# Patient Record
Sex: Female | Born: 1945 | Race: White | Hispanic: No | Marital: Married | Smoking: Never smoker
Health system: Southern US, Community
[De-identification: ages and names within clinical notes are randomized; demographics above are authoritative.]

## PROBLEM LIST (undated history)

## (undated) DIAGNOSIS — C801 Malignant (primary) neoplasm, unspecified: Secondary | ICD-10-CM

## (undated) DIAGNOSIS — M1990 Unspecified osteoarthritis, unspecified site: Secondary | ICD-10-CM

## (undated) DIAGNOSIS — T8859XA Other complications of anesthesia, initial encounter: Secondary | ICD-10-CM

## (undated) DIAGNOSIS — R928 Other abnormal and inconclusive findings on diagnostic imaging of breast: Secondary | ICD-10-CM

## (undated) DIAGNOSIS — I10 Essential (primary) hypertension: Secondary | ICD-10-CM

## (undated) DIAGNOSIS — K2270 Barrett's esophagus without dysplasia: Secondary | ICD-10-CM

## (undated) DIAGNOSIS — K219 Gastro-esophageal reflux disease without esophagitis: Secondary | ICD-10-CM

## (undated) DIAGNOSIS — N189 Chronic kidney disease, unspecified: Secondary | ICD-10-CM

## (undated) DIAGNOSIS — K3184 Gastroparesis: Secondary | ICD-10-CM

## (undated) HISTORY — DX: Essential (primary) hypertension: I10

## (undated) HISTORY — DX: Malignant (primary) neoplasm, unspecified: C80.1

## (undated) HISTORY — DX: Other abnormal and inconclusive findings on diagnostic imaging of breast: R92.8

## (undated) HISTORY — PX: ESOPHAGOGASTRODUODENOSCOPY: SHX1529

---

## 1966-07-12 DIAGNOSIS — C801 Malignant (primary) neoplasm, unspecified: Secondary | ICD-10-CM

## 1966-07-12 HISTORY — DX: Malignant (primary) neoplasm, unspecified: C80.1

## 1966-07-12 HISTORY — PX: DILATION AND CURETTAGE OF UTERUS: SHX78

## 1990-07-12 HISTORY — PX: BREAST BIOPSY: SHX20

## 1991-07-13 HISTORY — PX: APPENDECTOMY: SHX54

## 1991-07-13 HISTORY — PX: ABDOMINAL HYSTERECTOMY: SHX81

## 1993-07-12 DIAGNOSIS — I10 Essential (primary) hypertension: Secondary | ICD-10-CM

## 1993-07-12 HISTORY — DX: Essential (primary) hypertension: I10

## 1997-07-12 DIAGNOSIS — K3184 Gastroparesis: Secondary | ICD-10-CM

## 1997-07-12 HISTORY — DX: Gastroparesis: K31.84

## 2005-02-01 ENCOUNTER — Ambulatory Visit: Payer: Self-pay | Admitting: Unknown Physician Specialty

## 2005-04-28 ENCOUNTER — Ambulatory Visit: Payer: Self-pay | Admitting: Unknown Physician Specialty

## 2005-04-29 ENCOUNTER — Ambulatory Visit: Payer: Self-pay | Admitting: Unknown Physician Specialty

## 2006-07-06 ENCOUNTER — Ambulatory Visit: Payer: Self-pay | Admitting: Unknown Physician Specialty

## 2007-07-19 ENCOUNTER — Ambulatory Visit: Payer: Self-pay | Admitting: Unknown Physician Specialty

## 2008-10-10 ENCOUNTER — Ambulatory Visit: Payer: Self-pay | Admitting: Unknown Physician Specialty

## 2009-07-29 ENCOUNTER — Ambulatory Visit: Payer: Self-pay | Admitting: Unknown Physician Specialty

## 2010-08-20 ENCOUNTER — Ambulatory Visit: Payer: Self-pay | Admitting: Unknown Physician Specialty

## 2010-09-01 ENCOUNTER — Ambulatory Visit: Payer: Self-pay | Admitting: Unknown Physician Specialty

## 2011-03-03 ENCOUNTER — Ambulatory Visit: Payer: Self-pay | Admitting: Unknown Physician Specialty

## 2011-08-25 ENCOUNTER — Ambulatory Visit: Payer: Self-pay | Admitting: Unknown Physician Specialty

## 2012-09-06 ENCOUNTER — Ambulatory Visit: Payer: Self-pay | Admitting: Physician Assistant

## 2012-09-09 HISTORY — PX: BREAST BIOPSY: SHX20

## 2012-09-20 ENCOUNTER — Encounter: Payer: Self-pay | Admitting: *Deleted

## 2012-09-26 ENCOUNTER — Other Ambulatory Visit (INDEPENDENT_AMBULATORY_CARE_PROVIDER_SITE_OTHER): Payer: Medicare Other

## 2012-09-26 ENCOUNTER — Other Ambulatory Visit: Payer: Self-pay | Admitting: General Surgery

## 2012-09-26 ENCOUNTER — Encounter: Payer: Self-pay | Admitting: General Surgery

## 2012-09-26 ENCOUNTER — Ambulatory Visit (INDEPENDENT_AMBULATORY_CARE_PROVIDER_SITE_OTHER): Payer: Medicare Other | Admitting: General Surgery

## 2012-09-26 VITALS — BP 132/74 | HR 66 | Resp 12 | Ht 64.0 in | Wt 224.0 lb

## 2012-09-26 DIAGNOSIS — N63 Unspecified lump in unspecified breast: Secondary | ICD-10-CM

## 2012-09-26 NOTE — Progress Notes (Signed)
Subjective:     Patient ID: Megan Hodge, female   DOB: Jul 24, 1945, 67 y.o.   MRN: [id number]  HPI   Review of Systems  Constitutional: Negative.   Respiratory: Negative.   Cardiovascular: Negative.        Objective:   Physical Exam  Constitutional: She appears well-developed and well-nourished.  Neck: Normal range of motion. Neck supple.  Cardiovascular: Normal rate and normal heart sounds.   Pulmonary/Chest: Effort normal and breath sounds normal. Right breast exhibits no inverted nipple, no mass, no nipple discharge, no skin change and no tenderness. Left breast exhibits no inverted nipple, no nipple discharge, no skin change and no tenderness.       Assessment:     Atypical ductal hyperplasia of the right breast.  Recently completed biopsy for a well-defined nodular density in clock position of the right breast showed ADH.    Plan:     Formal excision of the area has been recommended. Ultrasound examination today confirmed a 0.5 x 0.6 x 0.6 x 1.5 cm cavity from the previous biopsy located 1 cm from the nipple to o'clock position.  The indications for excision were reviewed with the patient and her husband. We'll plan to complete this in a convenient date  As an outpatient at [facility].

## 2012-09-26 NOTE — Patient Instructions (Signed)
Patient to be scheduled for a right breast biopsy at [facility]. This patient has paperwork and instructions have been reviewed. She will be contacted once date has been arranged.

## 2012-09-27 ENCOUNTER — Telehealth: Payer: Self-pay | Admitting: *Deleted

## 2012-09-27 ENCOUNTER — Other Ambulatory Visit: Payer: Self-pay | Admitting: General Surgery

## 2012-09-27 ENCOUNTER — Encounter: Payer: Self-pay | Admitting: General Surgery

## 2012-09-27 NOTE — Telephone Encounter (Signed)
Patient was contacted today and informed right breast biopsy has been scheduled at [facility] for 10-05-12. She verbalizes understanding.

## 2012-09-28 ENCOUNTER — Ambulatory Visit: Payer: Self-pay

## 2012-10-02 ENCOUNTER — Ambulatory Visit: Payer: Self-pay | Admitting: Anesthesiology

## 2012-10-02 LAB — BASIC METABOLIC PANEL
Calcium, Total: 9.2 mg/dL (ref 8.5–10.1)
Co2: 32 mmol/L (ref 21–32)
Creatinine: 1.05 mg/dL (ref 0.60–1.30)
EGFR (African American): >60
EGFR (Non-African Amer.): 55 — ABNORMAL LOW
Potassium: 3.5 mmol/L (ref 3.5–5.1)

## 2012-10-03 ENCOUNTER — Other Ambulatory Visit: Payer: Self-pay | Admitting: General Surgery

## 2012-10-03 DIAGNOSIS — N6099 Unspecified benign mammary dysplasia of unspecified breast: Secondary | ICD-10-CM

## 2012-10-05 ENCOUNTER — Ambulatory Visit: Payer: Self-pay | Admitting: General Surgery

## 2012-10-05 DIAGNOSIS — N63 Unspecified lump in unspecified breast: Secondary | ICD-10-CM

## 2012-10-06 ENCOUNTER — Telehealth: Payer: Self-pay | Admitting: General Surgery

## 2012-10-06 NOTE — Telephone Encounter (Signed)
The patient underwent wide local excision at the site of the previous area of ADH the biopsy results showed benign breast tissue with columnar cell changes, a Parker metaplasia in the usual ductal hyperplasia. The biopsy cavity and clip was identified. No evidence of atypia or malignancy.  The patient has been notified of the results.  She reports she's had minimal discomfort since surgery. She will followup on 10/12/2012 as scheduled for her postoperative visit

## 2012-10-10 ENCOUNTER — Encounter: Payer: Self-pay | Admitting: General Surgery

## 2012-10-12 ENCOUNTER — Encounter: Payer: Self-pay | Admitting: General Surgery

## 2012-10-12 ENCOUNTER — Ambulatory Visit (INDEPENDENT_AMBULATORY_CARE_PROVIDER_SITE_OTHER): Payer: Medicare Other | Admitting: General Surgery

## 2012-10-12 VITALS — BP 130/70 | HR 76 | Resp 14 | Ht 64.0 in | Wt 225.0 lb

## 2012-10-12 DIAGNOSIS — N6099 Unspecified benign mammary dysplasia of unspecified breast: Secondary | ICD-10-CM

## 2012-10-12 DIAGNOSIS — N6089 Other benign mammary dysplasias of unspecified breast: Secondary | ICD-10-CM

## 2012-10-12 MED ORDER — TAMOXIFEN CITRATE 20 MG PO TABS
20.0000 mg | ORAL_TABLET | Freq: Every day | ORAL | Status: DC
Start: 2012-10-12 — End: 2013-04-04

## 2012-10-12 NOTE — Progress Notes (Signed)
Patient ID: Megan Hodge, female   DOB: 03/04/1946, 67 y.o.   MRN: [id number]  Chief Complaint  Patient presents with  . Routine Post Op    7-10 day breast biopsy    HPI Megan Hodge is a 67 y.o. female who presents for post op right breast biopsy. The procedure was performed on 10/05/12. The patient states she is doing well with the exception of getting comfortable while sleeping. She is no longer taking her pain medication.  HPI  Past Medical History  Diagnosis Date  . Hypertension 1995  . Cancer 1968    cervical cancer  . Abnormal mammogram, unspecified     right breast    Past Surgical History  Procedure Laterality Date  . Abdominal hysterectomy  1993  . Appendectomy  1993  . Dilation and curettage of uterus  1968  . Breast biopsy Right 1992  . Breast biopsy Right March 2014    atypical ductal hyperplasia.    Family History  Problem Relation Age of Onset  . Cancer Mother 7    lung    Social History History  Substance Use Topics  . Smoking status: Never Smoker   . Smokeless tobacco: Not on file  . Alcohol Use: No    Allergies  Allergen Reactions  . Ivp Dye (Iodinated Diagnostic Agents)     Redness     Current Outpatient Prescriptions  Medication Sig Dispense Refill  . benazepril-hydrochlorthiazide (LOTENSIN HCT) 20-12.5 MG per tablet Take 1 tablet by mouth daily.      . Omeprazole (PRILOSEC PO) Take by mouth.      . tamoxifen (NOLVADEX) 20 MG tablet Take 1 tablet (20 mg total) by mouth daily.  30 tablet  11   No current facility-administered medications for this visit.    Review of Systems Review of Systems  Constitutional: Negative.   Respiratory: Negative.   Cardiovascular: Negative.     Blood pressure 130/70, pulse 76, resp. rate 14, height 5\' 4"  (1.626 m), weight 225 lb (102.059 kg).  Physical Exam Physical Exam  Constitutional: She appears well-developed and well-nourished.  Pulmonary/Chest: Right breast exhibits no inverted nipple, no  mass, no nipple discharge, no skin change and no tenderness.    Data Reviewed The patient's original ultrasound-guided core biopsy showed an area of ADH. Formal reexploration completed October 05, 2012 showed no residual ADH. No DCIS or invasive cancer.  Assessment    ADH right breast.     Plan    The increased risk for breast malignancy and patient's identified with ADH was reviewed with the patient. The opportunity to make use of chemotherapy prevention to lower her risk by 50% (4% to 2%) was discussed. The major risks associated with tamoxifen therapy are DVT and vasomotor symptoms. .  The patient is amenable to a trial of tamoxifen, 20 mg daily. She's been asked to make use of a daily aspirin tablet with this to minimize the risk of DVT. She's been asked to do a phone followup in one month to report her tolerance of the medication.       Megan Hodge 10/12/2012, 10:14 PM

## 2012-10-12 NOTE — Patient Instructions (Addendum)
Patient advised of her options to prevent breast cancer. Options include taking Tamoxifen once a day with Aspirin or just continue to check by surveillance. Patient to call in 1 month with update on how she's doing on Tamoxifen. She is to return in 6 months with right diagnostic mammogram.

## 2012-10-24 ENCOUNTER — Encounter: Payer: Medicare Other | Admitting: General Surgery

## 2012-11-13 ENCOUNTER — Telehealth: Payer: Self-pay

## 2012-11-13 NOTE — Telephone Encounter (Signed)
Patient called to let you know that she did take her Tamoxifen for 30 days. She states she felt bad the whole time she was on it.  She has decided to stop taking the medication and wanted to let you know what her decision was.

## 2012-11-18 NOTE — Telephone Encounter (Signed)
Patient reports poor tolerance of Tamoxifen prescribed as ADH noted on March 2014 biopsy. Will follow with serial mammograms.

## 2013-03-14 ENCOUNTER — Encounter: Payer: Self-pay | Admitting: General Surgery

## 2013-03-14 ENCOUNTER — Ambulatory Visit: Payer: Self-pay | Admitting: General Surgery

## 2013-04-04 ENCOUNTER — Ambulatory Visit (INDEPENDENT_AMBULATORY_CARE_PROVIDER_SITE_OTHER): Payer: Medicare Other | Admitting: General Surgery

## 2013-04-04 ENCOUNTER — Encounter: Payer: Self-pay | Admitting: General Surgery

## 2013-04-04 VITALS — BP 124/68 | HR 74 | Resp 14 | Ht 65.0 in | Wt 226.0 lb

## 2013-04-04 DIAGNOSIS — N6099 Unspecified benign mammary dysplasia of unspecified breast: Secondary | ICD-10-CM

## 2013-04-04 DIAGNOSIS — N6089 Other benign mammary dysplasias of unspecified breast: Secondary | ICD-10-CM

## 2013-04-04 NOTE — Patient Instructions (Addendum)
Patient to return in six months from bilateral screening mammogram

## 2013-04-04 NOTE — Progress Notes (Signed)
Patient ID: Megan Hodge, female   DOB: [birth year], 67 y.o.   MRN: [id number]  Chief Complaint  Patient presents with  . Follow-up    right breast mammogram    HPI Megan Hodge is a 67 y.o. female who presents for a breast evaluation. The most recent right breast  mammogram was done on 03/14/13.  The patient underwent stereotactic biopsy for microcalcifications with identification of ADH in March 2014. Resection of this area showed no upstaging.  The patient took her tamoxifen for one month but found the vasomotor symptoms intolerable. Patient does perform regular self breast checks and gets regular mammograms done.    HPI  Past Medical History  Diagnosis Date  . Hypertension 1995  . Cancer 1968    cervical cancer  . Abnormal mammogram, unspecified     right breast    Past Surgical History  Procedure Laterality Date  . Abdominal hysterectomy  1993  . Appendectomy  1993  . Dilation and curettage of uterus  1968  . Breast biopsy Right 1992  . Breast biopsy Right March 2014    atypical ductal hyperplasia.    Family History  Problem Relation Age of Onset  . Cancer Mother 60    lung    Social History History  Substance Use Topics  . Smoking status: Never Smoker   . Smokeless tobacco: Not on file  . Alcohol Use: No    Allergies  Allergen Reactions  . Ivp Dye [Iodinated Diagnostic Agents]     Redness     Current Outpatient Prescriptions  Medication Sig Dispense Refill  . benazepril-hydrochlorthiazide (LOTENSIN HCT) 20-12.5 MG per tablet Take 1 tablet by mouth daily.      . Omeprazole (PRILOSEC PO) Take by mouth.       No current facility-administered medications for this visit.    Review of Systems Review of Systems  Constitutional: Negative.   Respiratory: Negative.   Cardiovascular: Negative.     Blood pressure 124/68, pulse 74, resp. rate 14, height 5\' 5"  (1.651 m), weight 226 lb (102.513 kg).  Physical Exam Physical Exam  Constitutional: She is  oriented to person, place, and time. She appears well-developed and well-nourished.  Cardiovascular: Normal rate, regular rhythm and normal heart sounds.   Pulmonary/Chest: Breath sounds normal. Right breast exhibits no inverted nipple, no mass, no nipple discharge, no skin change and no tenderness. Left breast exhibits no inverted nipple, no mass, no nipple discharge, no skin change and no tenderness.  Right breast incision well healed from 9 to 12 o'clock  Abdominal: Soft. Normal appearance.  Lymphadenopathy:    She has no cervical adenopathy.    She has no axillary adenopathy.  Neurological: She is alert and oriented to person, place, and time.  Skin: Skin is warm and dry.    Data Reviewed Pathology showed ADH.  Assessment    Doing well status post wide excision.     Plan    Options for further management were reviewed: 1) continued observation versus 2) trial of substituting Evista for tamoxifen. The patient will contact her insurance company to see what her cost might be.  We'll plan for bilateral screening mammograms in 6 months.        [name] 04/04/2013, 9:35 PM

## 2013-04-13 ENCOUNTER — Ambulatory Visit: Payer: Self-pay | Admitting: Unknown Physician Specialty

## 2013-04-15 IMAGING — US ULTRASOUND RIGHT BREAST
1 series · 13 of 21 positions shown · non-contrast
Comparison: 08/25/2011, 03/03/2011, 08/20/2010, 10/10/2008

Right Breast Ultrasound of 08/25/2011, 09/01/2010.

REASON FOR EXAM: RT BRST MASS FU AND YRLY
COMMENTS:

PROCEDURE:     US  - US BREAST RIGHT  - September 06, 2012 [DATE]
RESULT:

[Series 1: ultrasound right breast · 0.08mm/px · 13 of 21 slices shown]
[im 1/21]
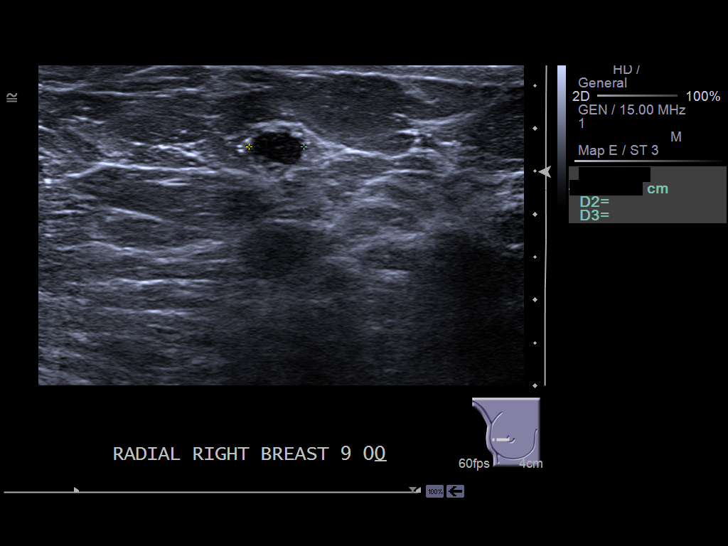
[im 3/21]
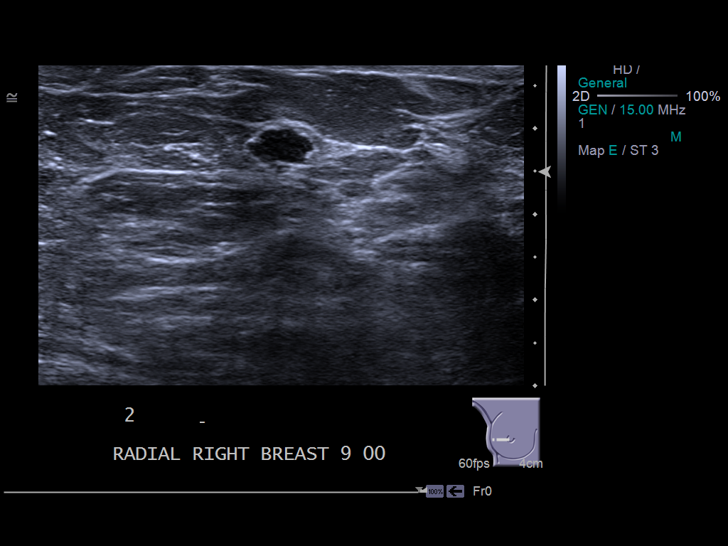
[im 5/21]
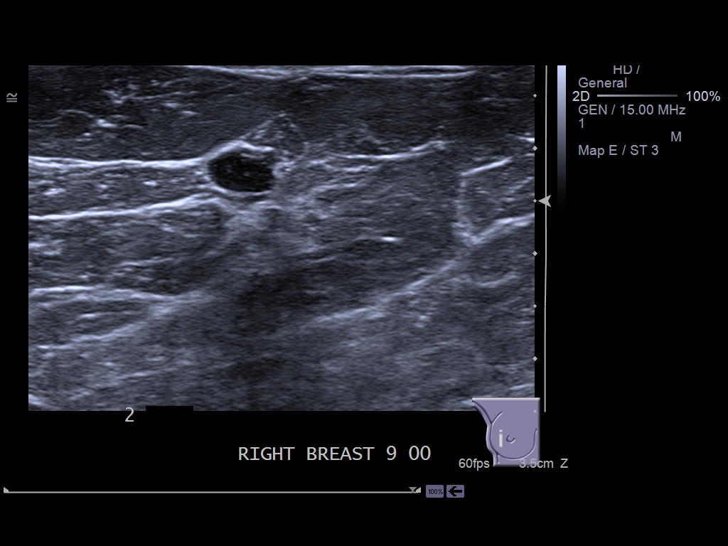
[im 6/21]
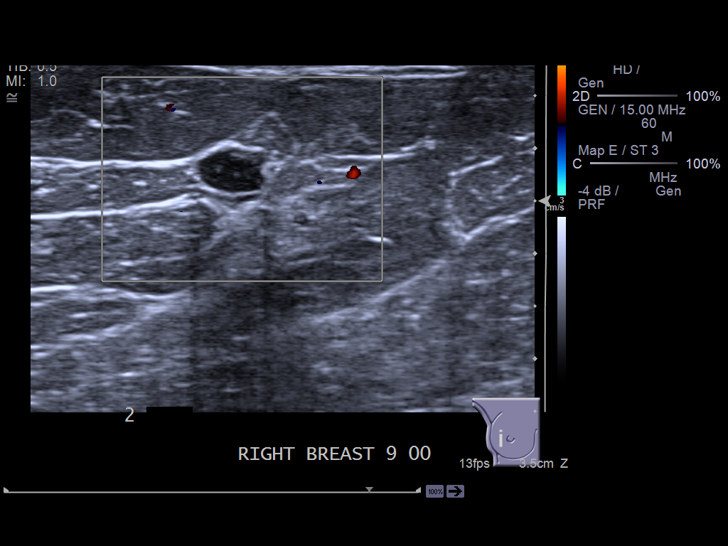
[im 8/21]
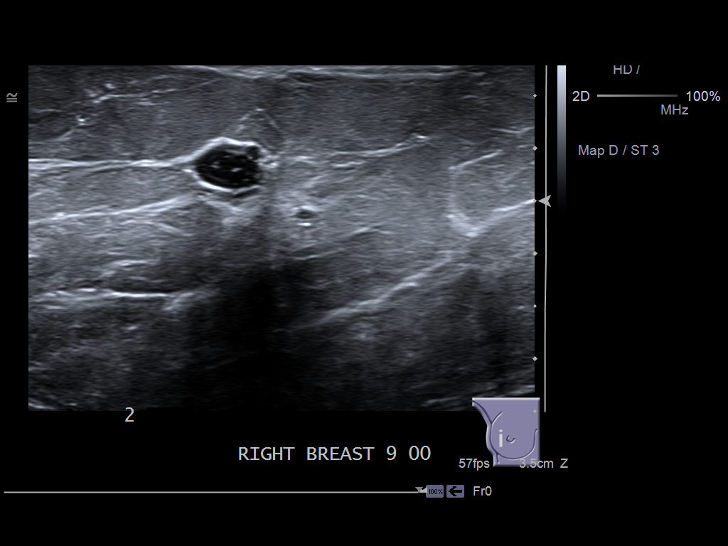
[im 9/21]
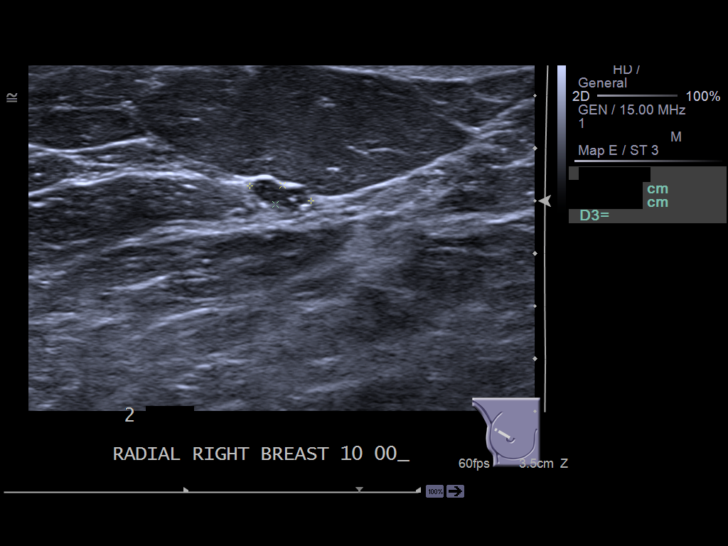
[im 11/21]
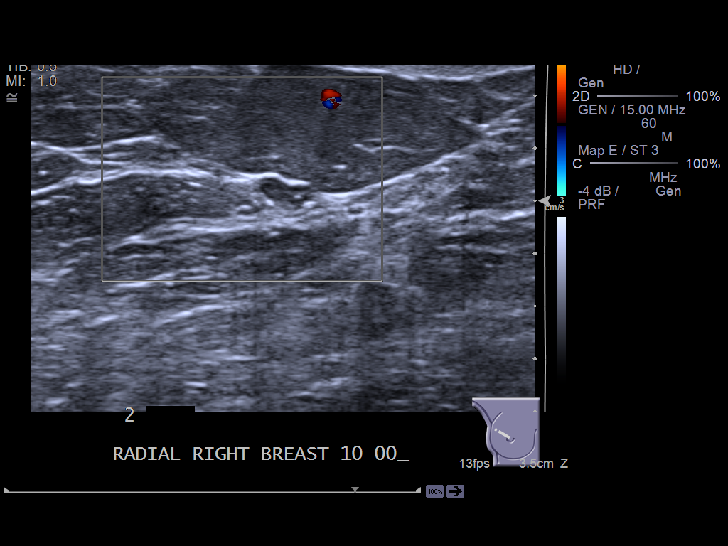
[im 13/21]
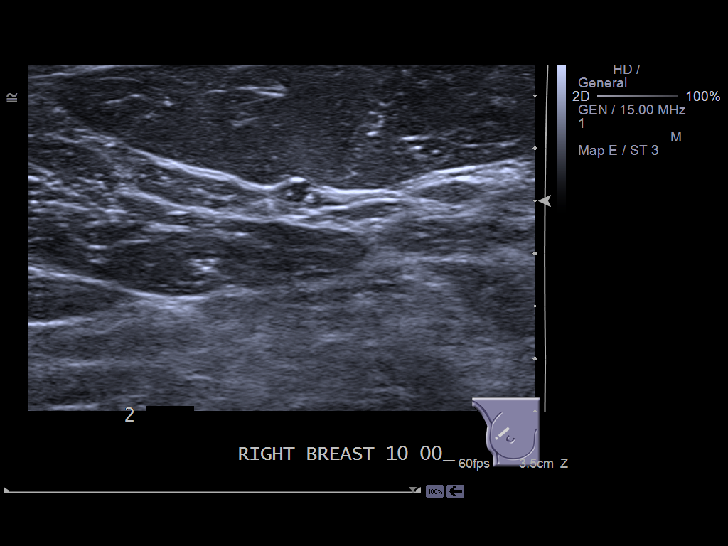
[im 14/21]
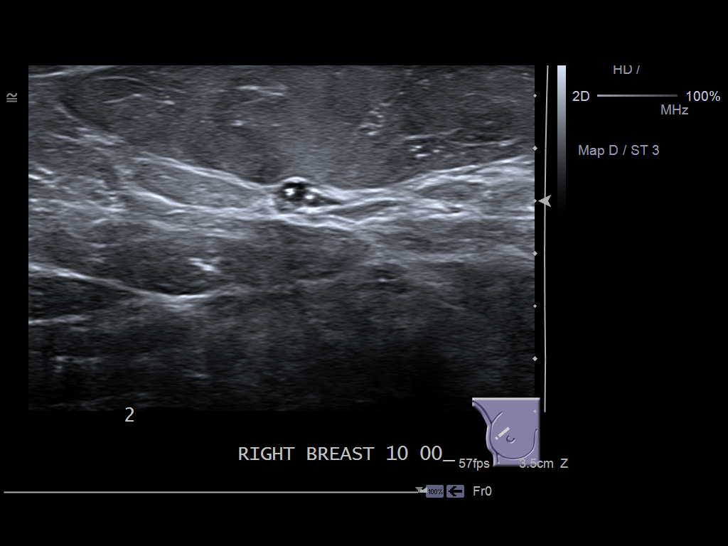
[im 16/21]
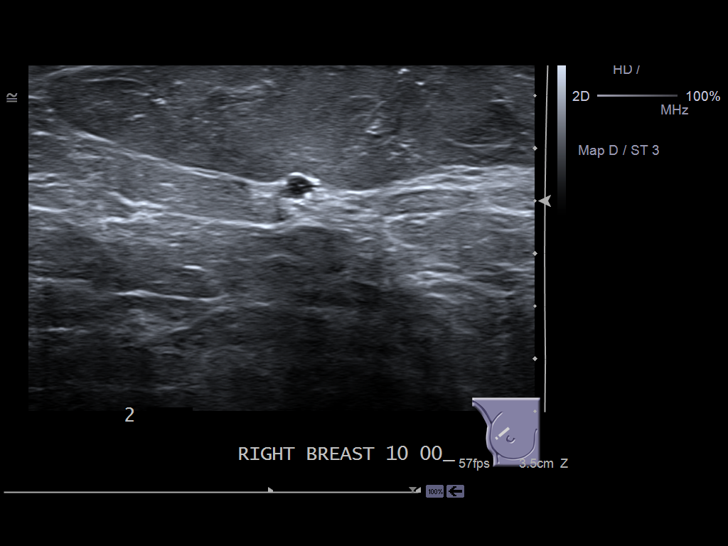
[im 17/21]
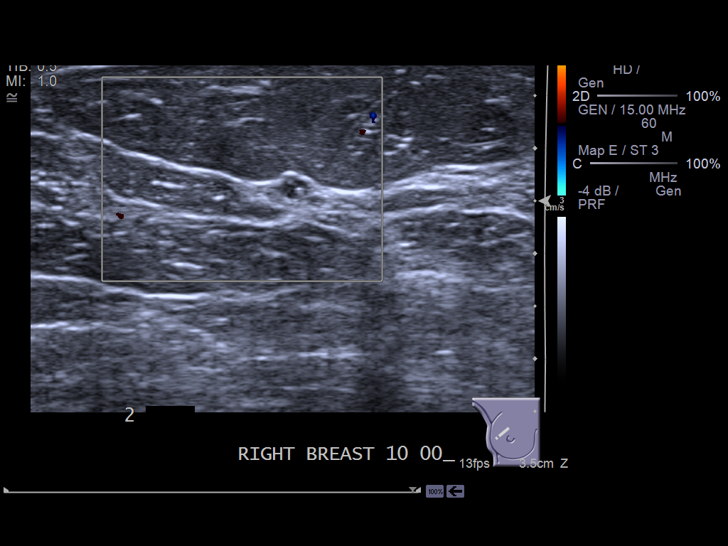
[im 19/21]
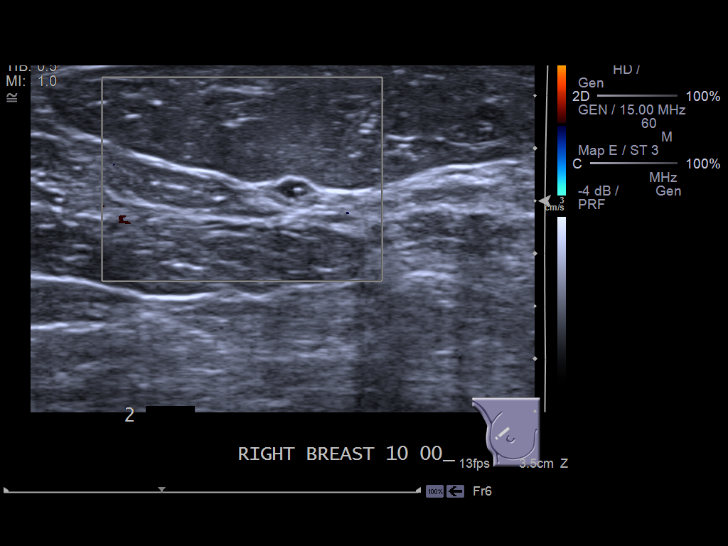
[im 21/21]
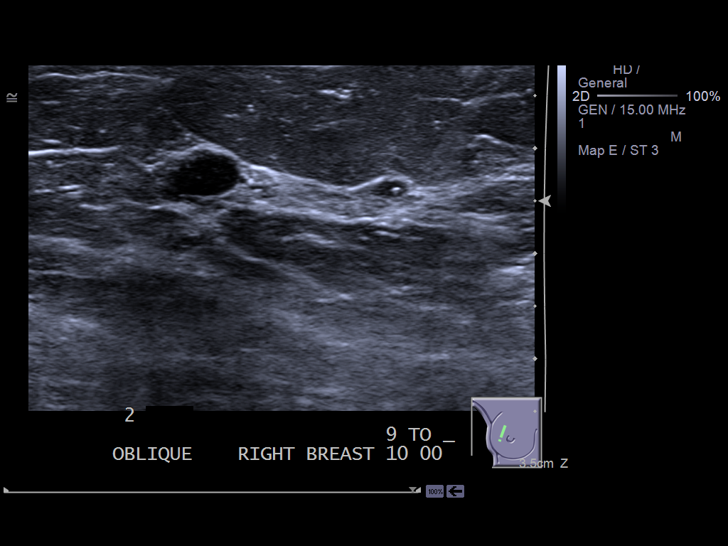

[13 of 21 positions shown; findings below may reference images not displayed]

FINDINGS: There is scattered fibroglandular tissue. The small, round mass in the far
anterior depth of the left breast retroareolar location is similar to
multiple prior studies and likely represents a cyst or fibroadenoma. Spot
compression magnification views were performed of the superolateral aspect
of the right breast anterior depth. Again demonstrated is the small, round
mass which is relatively similar to prior. Adjacent to the round mass there
is a small cluster of round calcifications. These appear more conspicuous
than on prior studies. Additionally, there is a small focal asymmetry in the
region of these calcifications which is more conspicuous than prior as well.

Real-time ultrasound was performed of the anterior right breast at 9-10:00.
At [DATE] there is a round mass which is hypoechoic. There does not appear to
be significant posterior acoustic shadowing. It measures 0.6 x 0.6 x 0.5 cm.
This is similar in size to prior. However, previously the mass was anechoic
and demonstrated acoustic through transmission. On today's study it is
hypoechoic. At [DATE] there is a small, lobulated hypoechoic mass. It
measures 0.6 x 0.4 x 0.2 cm. There is at least one, and possibly two,
hyperechoic foci within the mass which could represent calcifications. This
mass is slightly increased in conspicuity from prior studies. There is no
significant posterior acoustic shadowing or through transmission.
IMPRESSION: 1.     BI-RADS: Category 4 - Suspicious Abnormality. Surgical consultation
and tissue diagnosis are recommended. The small, lobulated mass at [DATE] is
slightly increased in size from prior. This mass likely correlates with the
findings of a small cluster of microcalcifications and associated focal
asymmetry on the mammograms, which have also increased slightly in
conspicuity from prior.
2.     The mass at [DATE] is similar in size to prior studies. However, on
today's study it is hypoechoic and without acoustic through transmission. It
is possible this is related to proteinaceous material or hemorrhage within a
previous cyst. However, given the findings today, cyst aspiration with
potential for biopsy if solid is recommended.

## 2013-09-10 ENCOUNTER — Ambulatory Visit: Payer: Self-pay | Admitting: General Surgery

## 2013-09-11 ENCOUNTER — Encounter: Payer: Self-pay | Admitting: General Surgery

## 2013-10-09 ENCOUNTER — Encounter: Payer: Self-pay | Admitting: General Surgery

## 2013-10-09 ENCOUNTER — Ambulatory Visit (INDEPENDENT_AMBULATORY_CARE_PROVIDER_SITE_OTHER): Payer: Medicare Other | Admitting: General Surgery

## 2013-10-09 VITALS — BP 130/74 | HR 68 | Resp 14 | Ht 65.0 in | Wt 229.0 lb

## 2013-10-09 DIAGNOSIS — N6089 Other benign mammary dysplasias of unspecified breast: Secondary | ICD-10-CM

## 2013-10-09 DIAGNOSIS — N6099 Unspecified benign mammary dysplasia of unspecified breast: Secondary | ICD-10-CM

## 2013-10-09 NOTE — Progress Notes (Signed)
Patient ID: Megan Hodge, female   DOB: January 24, 1946, 68 y.o.   MRN: [id number]  Chief Complaint  Patient presents with  . Follow-up    follow up bilateral screening mammogram    HPI Megan Hodge is a 68 y.o. female who presents for a breast evaluation. The most recent mammogram was done on 09/10/13. Patient does not perform regular self breast checks but does get regular mammograms done. No new problems with the breasts at this time.    HPI  Past Medical History  Diagnosis Date  . Hypertension 1995  . Cancer 1968    cervical cancer  . Abnormal mammogram, unspecified     right breast    Past Surgical History  Procedure Laterality Date  . Abdominal hysterectomy  1993  . Appendectomy  1993  . Dilation and curettage of uterus  1968  . Breast biopsy Right 1992  . Breast biopsy Right March 2014    atypical ductal hyperplasia.    Family History  Problem Relation Age of Onset  . Cancer Mother 90    lung    Social History History  Substance Use Topics  . Smoking status: Never Smoker   . Smokeless tobacco: Not on file  . Alcohol Use: No    Allergies  Allergen Reactions  . Ivp Dye [Iodinated Diagnostic Agents]     Redness     Current Outpatient Prescriptions  Medication Sig Dispense Refill  . benazepril-hydrochlorthiazide (LOTENSIN HCT) 20-12.5 MG per tablet Take 1 tablet by mouth daily.      . meloxicam (MOBIC) 7.5 MG tablet Take 1 tablet by mouth 2 (two) times daily as needed.      . Omeprazole (PRILOSEC PO) Take by mouth.       No current facility-administered medications for this visit.    Review of Systems Review of Systems  Constitutional: Negative.   Respiratory: Negative.   Cardiovascular: Negative.     Blood pressure 130/74, pulse 68, resp. rate 14, height 5\' 5"  (1.651 m), weight 229 lb (103.874 kg).  Physical Exam Physical Exam  Constitutional: She is oriented to person, place, and time. She appears well-developed and well-nourished.  Neck: Neck  supple. No thyromegaly present.  Cardiovascular: Normal rate, regular rhythm and normal heart sounds.   No murmur heard. Pulmonary/Chest: Effort normal and breath sounds normal. Right breast exhibits no inverted nipple, no mass, no nipple discharge, no skin change and no tenderness. Left breast exhibits no inverted nipple, no mass, no nipple discharge, no skin change and no tenderness.  Well healed incision at the areola of the right breast.   Lymphadenopathy:    She has no cervical adenopathy.    She has no axillary adenopathy.  Neurological: She is alert and oriented to person, place, and time.  Skin: Skin is warm and dry.    Data Reviewed Bilateral mammograms dated 09/10/2013 showed no interval change. BI-RAD-1.  Pathology from the 10/05/2012 biopsy showed benign breast tissue with columnar cell changes, apical metaplasia and ductal hyperplasia. No additional atypia was identified. The entire biopsy was submitted for microscopic review.  Original core biopsy dated 09/07/2012 showed atypical ductal hyperplasia in 2 of 2 tissue blocks. The original core biopsy was done on the lesion identified only on ultrasound in the 10:00 position measuring l 6 mm in diameter.  Cytology of a cystic lesion at the 9:00 position of the right breast dated 09/19/2012 showed extremely scant cellularity with rare ductal cells suggestive of a programmable place her. The lesion did  resolve completely on aspiration. Assessment    Atypical hyperplasia the right breast. The patient was unable to tolerate antiestrogen therapy    Plan    Continued surveillance is appropriate. The patient was amenable to a follow up examination in one year.    PCP and Ref. MD: Marinda Elk MD.    Robert Bellow 10/10/2013, 5:15 PM

## 2013-10-09 NOTE — Patient Instructions (Signed)
Patient to return in 1 year with bilateral screening mammogram. Continue self breast exams. Call office for any new breast issues or concerns.  

## 2014-05-13 ENCOUNTER — Encounter: Payer: Self-pay | Admitting: General Surgery

## 2014-08-12 HISTORY — PX: COLONOSCOPY: SHX174

## 2014-08-19 ENCOUNTER — Ambulatory Visit: Payer: Self-pay | Admitting: Unknown Physician Specialty

## 2014-09-16 ENCOUNTER — Encounter: Payer: Self-pay | Admitting: General Surgery

## 2014-09-25 ENCOUNTER — Ambulatory Visit (INDEPENDENT_AMBULATORY_CARE_PROVIDER_SITE_OTHER): Payer: PPO | Admitting: General Surgery

## 2014-09-25 ENCOUNTER — Encounter: Payer: Self-pay | Admitting: General Surgery

## 2014-09-25 VITALS — BP 130/78 | HR 76 | Resp 14 | Ht 65.0 in | Wt 224.0 lb

## 2014-09-25 DIAGNOSIS — N62 Hypertrophy of breast: Secondary | ICD-10-CM | POA: Diagnosis not present

## 2014-09-25 DIAGNOSIS — N6099 Unspecified benign mammary dysplasia of unspecified breast: Secondary | ICD-10-CM | POA: Insufficient documentation

## 2014-09-25 NOTE — Progress Notes (Signed)
Patient ID: MERCY LEPPLA, female   DOB: 29-Jan-1946, 69 y.o.   MRN: [id number]  Chief Complaint  Patient presents with  . Follow-up    mammogram    HPI Megan Hodge is a 69 y.o. female who presents for a breast evaluation. The most recent mammogram was done on 09/13/14. Patient does perform regular self breast checks and gets regular mammograms done.    HPI  Past Medical History  Diagnosis Date  . Hypertension 1995  . Cancer 1968    cervical cancer  . Abnormal mammogram, unspecified     right breast    Past Surgical History  Procedure Laterality Date  . Abdominal hysterectomy  1993  . Appendectomy  1993  . Dilation and curettage of uterus  1968  . Colonoscopy  08/2014    Dr. Vira Agar  . Breast biopsy Right 1992  . Breast biopsy Right March 2014    Atypical ductal hyperplasia. Failed trial of tamoxifen for chemoprevention.    Family History  Problem Relation Age of Onset  . Cancer Mother 67    lung    Social History History  Substance Use Topics  . Smoking status: Never Smoker   . Smokeless tobacco: Not on file  . Alcohol Use: No    Allergies  Allergen Reactions  . Ivp Dye [Iodinated Diagnostic Agents]     Redness     Current Outpatient Prescriptions  Medication Sig Dispense Refill  . benazepril-hydrochlorthiazide (LOTENSIN HCT) 20-12.5 MG per tablet Take 1 tablet by mouth daily.    . Omeprazole (PRILOSEC PO) Take by mouth.     No current facility-administered medications for this visit.    Review of Systems Review of Systems  Constitutional: Negative.   Respiratory: Negative.   Cardiovascular: Negative.     Blood pressure 130/78, pulse 76, resp. rate 14, height 5\' 5"  (1.651 m), weight 224 lb (101.606 kg).  Physical Exam Physical Exam  Constitutional: She is oriented to person, place, and time. She appears well-developed and well-nourished.  Eyes: Conjunctivae are normal. No scleral icterus.  Neck: Neck supple.  Cardiovascular: Normal rate,  regular rhythm and normal heart sounds.   Pulmonary/Chest: Effort normal and breath sounds normal. Right breast exhibits no inverted nipple, no mass, no nipple discharge, no skin change and no tenderness. Left breast exhibits no inverted nipple, no mass, no nipple discharge, no skin change and no tenderness.  Abdominal: Soft. Bowel sounds are normal. There is no tenderness.  Lymphadenopathy:    She has no cervical adenopathy.    She has no axillary adenopathy.  Neurological: She is alert and oriented to person, place, and time.  Skin: Skin is warm.    Data Reviewed Screening mammograms dated 09/13/2014 were reviewed and compared to prior studies. Postsurgical changes noted. Stable 1 cm oval mass in the retroareolar area of the left breast. BI-RADS-2.   Assessment    ADH, benign breast exam.     Plan    The patient was unable to tolerate tamoxifen to reduce her risk of malignancy.  Patient will be asked to return to the office in one year with a bilateral screening mammogram.    PCP and Ref. MD: Marinda Elk     Robert Bellow 09/25/2014, 9:31 PM

## 2014-09-25 NOTE — Patient Instructions (Addendum)
Continue self breast exams. Call office for any new breast issues or concerns.The patient has been asked to return to the office in one year with a bilateral screening mammogram. 

## 2014-11-01 NOTE — Op Note (Signed)
PATIENT NAME:  Megan Hodge, Megan Hodge MR#:  [id number] DATE OF BIRTH:  May 31, 1946  DATE OF PROCEDURE:  10/05/2012  PREOPERATIVE DIAGNOSIS: Atypical ductal hyperplasia of the right breast.   POSTOPERATIVE DIAGNOSIS: Atypical ductal hyperplasia of the right breast.  OPERATIVE PROCEDURE: Ultrasound-guided wide local excision.   SURGEON: Hervey Ard, MD   ANESTHESIA: General by LMA under Dr. Carolin Sicks, Marcaine 0.5% with 1:200,000 units of epinephrine, 30 mL local infiltration.   ESTIMATED BLOOD LOSS: Minimal.   CLINICAL NOTE: This 69 year old woman underwent a vacuum-assisted biopsy of a right breast lesion showing evidence of atypical ductal hyperplasia. Wide local excision was recommended to confirm no additional pathology.   OPERATIVE NOTE: With the patient under adequate general anesthesia by LMA, the right breast was prepped with ChloraPrep and draped. Ultrasound was used to confirm the previous biopsy cavity. A circumareolar incision from the 7 to 11 o'clock position was made and carried down through the skin and subcutaneous tissue. Hemostasis was achieved with electrocautery. A block of tissue approximately 4 x 5 x 4 cm in diameter was excised down but not including the pectoralis fascia. The specimen was orientated and specimen radiograph confirmed the previously placed biopsy clip in the lesion in the resected tissue. The specimen was then sent fresh to pathology for margin inking. The defect was closed with interrupted 2-0 Vicryl figure-of-eight sutures in multiple layers. The skin incision was closed with a running 4-0 Vicryl subcuticular suture. Benzoin and Steri-Strips, followed by Telfa and Tegaderm dressing were applied.        The patient tolerated the procedure well and was taken to the recovery room in stable condition.  ____________________________ Robert Bellow, MD [email or web address] D: 10/05/2012 21:12:00 ET T: 10/05/2012 23:46:51 ET JOB#: [id number]  cc: Robert Bellow, MD,  <Dictator> Luke Rigsbee Amedeo Kinsman MD ELECTRONICALLY SIGNED 10/06/2012 13:33

## 2014-11-04 LAB — SURGICAL PATHOLOGY

## 2015-07-24 DIAGNOSIS — I10 Essential (primary) hypertension: Secondary | ICD-10-CM | POA: Diagnosis not present

## 2015-07-24 DIAGNOSIS — N183 Chronic kidney disease, stage 3 (moderate): Secondary | ICD-10-CM | POA: Diagnosis not present

## 2015-07-24 DIAGNOSIS — E782 Mixed hyperlipidemia: Secondary | ICD-10-CM | POA: Diagnosis not present

## 2015-07-29 DIAGNOSIS — N6099 Unspecified benign mammary dysplasia of unspecified breast: Secondary | ICD-10-CM | POA: Diagnosis not present

## 2015-07-29 DIAGNOSIS — I10 Essential (primary) hypertension: Secondary | ICD-10-CM | POA: Diagnosis not present

## 2015-07-29 DIAGNOSIS — K2270 Barrett's esophagus without dysplasia: Secondary | ICD-10-CM | POA: Diagnosis not present

## 2015-07-29 DIAGNOSIS — N183 Chronic kidney disease, stage 3 (moderate): Secondary | ICD-10-CM | POA: Diagnosis not present

## 2015-07-29 DIAGNOSIS — K219 Gastro-esophageal reflux disease without esophagitis: Secondary | ICD-10-CM | POA: Diagnosis not present

## 2015-07-29 DIAGNOSIS — R5383 Other fatigue: Secondary | ICD-10-CM | POA: Diagnosis not present

## 2015-07-29 DIAGNOSIS — E782 Mixed hyperlipidemia: Secondary | ICD-10-CM | POA: Diagnosis not present

## 2015-09-05 DIAGNOSIS — H2513 Age-related nuclear cataract, bilateral: Secondary | ICD-10-CM | POA: Diagnosis not present

## 2015-09-17 DIAGNOSIS — Z1231 Encounter for screening mammogram for malignant neoplasm of breast: Secondary | ICD-10-CM | POA: Diagnosis not present

## 2015-09-18 ENCOUNTER — Encounter: Payer: Self-pay | Admitting: General Surgery

## 2015-09-23 ENCOUNTER — Ambulatory Visit: Payer: PPO | Admitting: General Surgery

## 2015-09-30 ENCOUNTER — Ambulatory Visit (INDEPENDENT_AMBULATORY_CARE_PROVIDER_SITE_OTHER): Payer: PPO | Admitting: General Surgery

## 2015-09-30 ENCOUNTER — Encounter: Payer: Self-pay | Admitting: General Surgery

## 2015-09-30 VITALS — BP 134/84 | HR 74 | Resp 12 | Ht 64.0 in | Wt 228.0 lb

## 2015-09-30 DIAGNOSIS — N62 Hypertrophy of breast: Secondary | ICD-10-CM

## 2015-09-30 DIAGNOSIS — N6099 Unspecified benign mammary dysplasia of unspecified breast: Secondary | ICD-10-CM

## 2015-09-30 NOTE — Progress Notes (Signed)
Patient ID: Megan Hodge, female   DOB: [birth year], 70 y.o.   MRN: [id number]  Chief Complaint  Patient presents with  . Follow-up    mammogram    HPI Megan Hodge is a 70 y.o. female who presents for a breast evaluation. The most recent mammogram was done on 09/12/15 .  Patient  does perform regular self breast checks and gets regular mammograms done.   I personally reviewed the patient history. HPI  Past Medical History  Diagnosis Date  . Hypertension 1995  . Cancer [facility]) 1968    cervical cancer  . Abnormal mammogram, unspecified     right breast    Past Surgical History  Procedure Laterality Date  . Abdominal hysterectomy  1993  . Appendectomy  1993  . Dilation and curettage of uterus  1968  . Colonoscopy  08/2014    Dr. Vira Agar  . Breast biopsy Right 1992  . Breast biopsy Right March 2014    Atypical ductal hyperplasia. Failed trial of tamoxifen for chemoprevention.    Family History  Problem Relation Age of Onset  . Cancer Mother 65    lung    Social History Social History  Substance Use Topics  . Smoking status: Never Smoker   . Smokeless tobacco: None  . Alcohol Use: No    Allergies  Allergen Reactions  . Ivp Dye [Iodinated Diagnostic Agents]     Redness     Current Outpatient Prescriptions  Medication Sig Dispense Refill  . benazepril-hydrochlorthiazide (LOTENSIN HCT) 20-12.5 MG per tablet Take 1 tablet by mouth daily.    . metoprolol succinate (TOPROL-XL) 25 MG 24 hr tablet     . Omeprazole (PRILOSEC PO) Take by mouth.     No current facility-administered medications for this visit.    Review of Systems Review of Systems  Constitutional: Negative.   Respiratory: Negative.   Cardiovascular: Negative.     Blood pressure 134/84, pulse 74, resp. rate 12, height 5\' 4"  (1.626 m), weight 228 lb (103.42 kg).  Physical Exam Physical Exam  Constitutional: She is oriented to person, place, and time. She appears well-developed and  well-nourished.  Eyes: Conjunctivae are normal. No scleral icterus.  Neck: Neck supple.  Cardiovascular: Normal rate, regular rhythm and normal heart sounds.   Pulmonary/Chest: Effort normal and breath sounds normal. Right breast exhibits no inverted nipple, no mass, no nipple discharge, no skin change and no tenderness. Left breast exhibits no inverted nipple, no mass, no nipple discharge, no skin change and no tenderness.    Right breast well healed incision from 9 to 12.   Abdominal: Soft. Normal appearance and bowel sounds are normal. There is no tenderness.  Lymphadenopathy:    She has no cervical adenopathy.  Neurological: She is alert and oriented to person, place, and time.  Skin: Skin is warm and dry.    Data Reviewed Bilateral mammograms dated 09/17/2015 were reviewed. There are significantly more calcifications at site of previous biopsy have developed. Thought to reside within the skin. BI-RADS-2.  Assessment    Benign breast exam.    Plan    We'll plan on a repeat exam in one year.   Patient will be asked to return to the office in one year with a bilateral screening mammogram. PCP:  [place] This information has been scribed by Gaspar Cola CMA.    Robert Bellow 09/30/2015, 2:28 PM

## 2015-09-30 NOTE — Patient Instructions (Signed)
Patient will be asked to return to the office in one year with a bilateral screening mammogram. 

## 2016-01-21 DIAGNOSIS — R5383 Other fatigue: Secondary | ICD-10-CM | POA: Diagnosis not present

## 2016-01-21 DIAGNOSIS — K2270 Barrett's esophagus without dysplasia: Secondary | ICD-10-CM | POA: Diagnosis not present

## 2016-01-21 DIAGNOSIS — E782 Mixed hyperlipidemia: Secondary | ICD-10-CM | POA: Diagnosis not present

## 2016-01-21 DIAGNOSIS — N183 Chronic kidney disease, stage 3 (moderate): Secondary | ICD-10-CM | POA: Diagnosis not present

## 2016-01-21 DIAGNOSIS — I10 Essential (primary) hypertension: Secondary | ICD-10-CM | POA: Diagnosis not present

## 2016-01-29 DIAGNOSIS — N183 Chronic kidney disease, stage 3 (moderate): Secondary | ICD-10-CM | POA: Diagnosis not present

## 2016-01-29 DIAGNOSIS — N6099 Unspecified benign mammary dysplasia of unspecified breast: Secondary | ICD-10-CM | POA: Diagnosis not present

## 2016-01-29 DIAGNOSIS — Z0000 Encounter for general adult medical examination without abnormal findings: Secondary | ICD-10-CM | POA: Diagnosis not present

## 2016-01-29 DIAGNOSIS — K2270 Barrett's esophagus without dysplasia: Secondary | ICD-10-CM | POA: Diagnosis not present

## 2016-01-29 DIAGNOSIS — Z23 Encounter for immunization: Secondary | ICD-10-CM | POA: Diagnosis not present

## 2016-01-29 DIAGNOSIS — E782 Mixed hyperlipidemia: Secondary | ICD-10-CM | POA: Diagnosis not present

## 2016-01-29 DIAGNOSIS — I10 Essential (primary) hypertension: Secondary | ICD-10-CM | POA: Diagnosis not present

## 2016-01-29 DIAGNOSIS — K219 Gastro-esophageal reflux disease without esophagitis: Secondary | ICD-10-CM | POA: Diagnosis not present

## 2016-04-11 HISTORY — PX: JOINT REPLACEMENT: SHX530

## 2016-04-15 ENCOUNTER — Encounter: Payer: Self-pay | Admitting: Emergency Medicine

## 2016-04-15 ENCOUNTER — Emergency Department: Payer: PPO

## 2016-04-15 ENCOUNTER — Emergency Department
Admission: EM | Admit: 2016-04-15 | Discharge: 2016-04-15 | Disposition: A | Discharge status: Other Acute Inpatient Hospital | Payer: PPO | Attending: Emergency Medicine | Admitting: Emergency Medicine

## 2016-04-15 DIAGNOSIS — I10 Essential (primary) hypertension: Secondary | ICD-10-CM | POA: Diagnosis not present

## 2016-04-15 DIAGNOSIS — M79601 Pain in right arm: Secondary | ICD-10-CM | POA: Diagnosis not present

## 2016-04-15 DIAGNOSIS — S52501A Unspecified fracture of the lower end of right radius, initial encounter for closed fracture: Secondary | ICD-10-CM | POA: Diagnosis not present

## 2016-04-15 DIAGNOSIS — Y9301 Activity, walking, marching and hiking: Secondary | ICD-10-CM | POA: Diagnosis not present

## 2016-04-15 DIAGNOSIS — Y999 Unspecified external cause status: Secondary | ICD-10-CM | POA: Diagnosis not present

## 2016-04-15 DIAGNOSIS — S52121A Displaced fracture of head of right radius, initial encounter for closed fracture: Secondary | ICD-10-CM | POA: Insufficient documentation

## 2016-04-15 DIAGNOSIS — W108XXA Fall (on) (from) other stairs and steps, initial encounter: Secondary | ICD-10-CM | POA: Insufficient documentation

## 2016-04-15 DIAGNOSIS — S4991XA Unspecified injury of right shoulder and upper arm, initial encounter: Secondary | ICD-10-CM | POA: Diagnosis not present

## 2016-04-15 DIAGNOSIS — S52091A Other fracture of upper end of right ulna, initial encounter for closed fracture: Secondary | ICD-10-CM | POA: Insufficient documentation

## 2016-04-15 DIAGNOSIS — Y92009 Unspecified place in unspecified non-institutional (private) residence as the place of occurrence of the external cause: Secondary | ICD-10-CM | POA: Insufficient documentation

## 2016-04-15 DIAGNOSIS — Z8541 Personal history of malignant neoplasm of cervix uteri: Secondary | ICD-10-CM | POA: Insufficient documentation

## 2016-04-15 DIAGNOSIS — S42401A Unspecified fracture of lower end of right humerus, initial encounter for closed fracture: Secondary | ICD-10-CM

## 2016-04-15 DIAGNOSIS — Z7401 Bed confinement status: Secondary | ICD-10-CM | POA: Diagnosis not present

## 2016-04-15 DIAGNOSIS — S80211A Abrasion, right knee, initial encounter: Secondary | ICD-10-CM | POA: Diagnosis not present

## 2016-04-15 DIAGNOSIS — S0081XA Abrasion of other part of head, initial encounter: Secondary | ICD-10-CM | POA: Diagnosis not present

## 2016-04-15 DIAGNOSIS — T07XXXA Unspecified multiple injuries, initial encounter: Secondary | ICD-10-CM

## 2016-04-15 DIAGNOSIS — S52001A Unspecified fracture of upper end of right ulna, initial encounter for closed fracture: Secondary | ICD-10-CM | POA: Diagnosis not present

## 2016-04-15 LAB — CBC WITH DIFFERENTIAL/PLATELET
BASOS ABS: 0 10*3/uL (ref 0–0.1)
BASOS PCT: 0 %
EOS ABS: 0 10*3/uL (ref 0–0.7)
Eosinophils Relative: 0 %
HCT: 39.9 % (ref 35.0–47.0)
HEMOGLOBIN: 13.5 g/dL (ref 12.0–16.0)
Lymphocytes Relative: 10 %
Lymphs Abs: 1.3 10*3/uL (ref 1.0–3.6)
MCH: 30.2 pg (ref 26.0–34.0)
MCHC: 33.8 g/dL (ref 32.0–36.0)
MCV: 89.3 fL (ref 80.0–100.0)
Monocytes Absolute: 0.7 10*3/uL (ref 0.2–0.9)
Monocytes Relative: 6 %
NEUTROS PCT: 84 %
Neutro Abs: 10.5 10*3/uL — ABNORMAL HIGH (ref 1.4–6.5)
Platelets: 179 10*3/uL (ref 150–440)
RBC: 4.46 MIL/uL (ref 3.80–5.20)
RDW: 14.1 % (ref 11.5–14.5)
WBC: 12.6 10*3/uL — AB (ref 3.6–11.0)

## 2016-04-15 LAB — BASIC METABOLIC PANEL
Anion gap: 7 (ref 5–15)
BUN: 21 mg/dL — ABNORMAL HIGH (ref 6–20)
CO2: 27 mmol/L (ref 22–32)
CREATININE: 1.14 mg/dL — AB (ref 0.44–1.00)
Calcium: 9.4 mg/dL (ref 8.9–10.3)
Chloride: 104 mmol/L (ref 101–111)
GFR calc non Af Amer: 48 mL/min — ABNORMAL LOW (ref >60)
GFR, EST AFRICAN AMERICAN: 56 mL/min — AB (ref >60)
Glucose, Bld: 116 mg/dL — ABNORMAL HIGH (ref 65–99)
POTASSIUM: 4 mmol/L (ref 3.5–5.1)
SODIUM: 138 mmol/L (ref 135–145)

## 2016-04-15 MED ORDER — MORPHINE SULFATE (PF) 2 MG/ML IV SOLN
2.0000 mg | Freq: Once | INTRAVENOUS | Status: AC
  Administered 2016-04-15: 2 mg via INTRAVENOUS
  Filled 2016-04-15: qty 1

## 2016-04-15 MED ORDER — SODIUM CHLORIDE 0.9 % IV SOLN
Freq: Once | INTRAVENOUS | Status: AC
  Administered 2016-04-15: 16:00:00 via INTRAVENOUS

## 2016-04-15 NOTE — ED Triage Notes (Signed)
Pt presents to ED with reports of falling down two outdoor steps today at home. Pt denies dizziness prior to fall or LOC. Pt with abrasions to face and nose and right knee. Pt reports she thinks she may have broken her right arm. Pt reports right elbow pain. Pt able to move fingers but states she is unable to move her arm. Swelling noted to right forearm area.

## 2016-04-15 NOTE — ED Notes (Signed)
Attempted to call report to [facility], was told the nurse was tied up and I gave her my phone number to call back when she was free.

## 2016-04-15 NOTE — ED Notes (Signed)
Called [facility] spoke to [place] for transfer at 1552, [facility] transfer on hold due to insurance of patient

## 2016-04-15 NOTE — ED Notes (Signed)
[facility] to initiate transfer spoke to [place] at [phone]

## 2016-04-15 NOTE — ED Notes (Signed)
pts right knee was wrapped with gauze per request of family.

## 2016-04-15 NOTE — ED Notes (Signed)
Called to check on status at [facility]

## 2016-04-15 NOTE — ED Provider Notes (Signed)
[facility] Emergency Department Provider Note    ____________________________________________   I have reviewed the triage vital signs and the nursing notes.   HISTORY  Chief Complaint Fall and Arm Pain   History limited by: Not Limited   HPI Megan Hodge is a 70 y.o. female who presents to the emergency department today because of concerns for right elbow pain after fall. The pain is tolerable when she is not moving her arm. The patient was walking down steps when she fell. She landed on her right elbow and then her face. She thinks that she might just tripped. She denies any concurrent chest pain shortness breath palpitations. She was able to get up and walk after this happened however cannot use her right arm. She denies any loss of consciousness. Denies any neck pain.   Past Medical History:  Diagnosis Date  . Abnormal mammogram, unspecified    right breast  . Cancer ([place]) 1968   cervical cancer  . Hypertension 1995    Patient Active Problem List   Diagnosis Date Noted  . 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 Atypical ductal hyperplasia, breast 09/26/2012    Class: Diagnosis of    Past Surgical History:  Procedure Laterality Date  . ABDOMINAL HYSTERECTOMY  1993  . APPENDECTOMY  1993  . BREAST BIOPSY Right 1992  . BREAST BIOPSY Right March 2014   Atypical ductal hyperplasia. Failed trial of tamoxifen for chemoprevention.  . COLONOSCOPY  08/2014   Dr. Vira Agar  . DILATION AND CURETTAGE OF UTERUS  1968    Prior to Admission medications   Medication Sig Start Date End Date Taking? Authorizing Provider  benazepril-hydrochlorthiazide (LOTENSIN HCT) 20-12.5 MG per tablet Take 1 tablet by mouth daily.    Historical Provider, MD  metoprolol succinate (TOPROL-XL) 25 MG 24 hr tablet  09/27/15   Historical Provider, MD  Omeprazole (PRILOSEC PO) Take by mouth.    Historical Provider, MD    Allergies Ivp dye [iodinated diagnostic  agents]  Family History  Problem Relation Age of Onset  . Cancer Mother 69    lung    Social History Social History  Substance Use Topics  . Smoking status: Never Smoker  . Smokeless tobacco: Not on file  . Alcohol use No    Review of Systems  Constitutional: Negative for fever. Cardiovascular: Negative for chest pain. Respiratory: Negative for shortness of breath. Gastrointestinal: Negative for abdominal pain, vomiting and diarrhea. Genitourinary: Negative for dysuria. Musculoskeletal: Positive for right elbow pain. Skin: Positive for abrasion over her face as well as abrasion over her right knee. Neurological: Negative for headaches, focal weakness or numbness. 10-point ROS otherwise negative.  ____________________________________________   PHYSICAL EXAM:  VITAL SIGNS: ED Triage Vitals  Enc Vitals Group     BP 04/15/16 1406 (!) 99/47     Pulse Rate 04/15/16 1406 65     Resp 04/15/16 1406 18     Temp 04/15/16 1406 98.2 F (36.8 C)     Temp Source 04/15/16 1406 Oral     SpO2 04/15/16 1406 100 %     Weight 04/15/16 1407 230 lb (104.3 kg)     Height 04/15/16 1407 5' 5.5" (1.664 m)     Head Circumference --      Peak Flow --      Pain Score 04/15/16 1407 8   Constitutional: Alert and oriented. Well appearing and in no distress. Eyes: Conjunctivae are normal. Normal extraocular movements. ENT  Head: Normocephalic. Superficial abrasions over the middle of her forehead, and nose.   Nose: No congestion/rhinnorhea.   Mouth/Throat: Mucous membranes are moist.   Neck: No stridor. Hematological/Lymphatic/Immunilogical: No cervical lymphadenopathy. Cardiovascular: Normal rate, regular rhythm.  No murmurs, rubs, or gallops. Respiratory: Normal respiratory effort without tachypnea nor retractions. Breath sounds are clear and equal bilaterally. No wheezes/rales/rhonchi. Gastrointestinal: Soft and nontender. No distention.  Genitourinary:  Deferred Musculoskeletal: Right elbow with swelling. Tender to palpation and manipulation. RP 2+, sensation intact distally. Grip strength 5/5. Abrasions over right knee, superficial, able to flex knee without any tenderness.  Neurologic:  Normal speech and language. No gross focal neurologic deficits are appreciated.  Skin:  Abrasion to face and right knee. Psychiatric: Mood and affect are normal. Speech and behavior are normal. Patient exhibits appropriate insight and judgment.  ____________________________________________    LABS (pertinent positives/negatives)  Labs Reviewed  CBC WITH DIFFERENTIAL/PLATELET - Abnormal; Notable for the following:       Result Value   WBC 12.6 (*)    Neutro Abs 10.5 (*)    All other components within normal limits  BASIC METABOLIC PANEL - Abnormal; Notable for the following:    Glucose, Bld 116 (*)    BUN 21 (*)    Creatinine, Ser 1.14 (*)    GFR calc non Af Amer 48 (*)    GFR calc Af Amer 56 (*)    All other components within normal limits     ____________________________________________   EKG  None  ____________________________________________    RADIOLOGY  Right Forearm IMPRESSION:  Grossly comminuted displaced fractures of the right radial head and  proximal ulna with involvement of the elbow joint space.     I, Jailani Hogans, personally viewed and evaluated these images (plain radiographs) as part of my medical decision making. ____________________________________________   PROCEDURES  Procedures  ____________________________________________   INITIAL IMPRESSION / ASSESSMENT AND PLAN / ED COURSE  Pertinent labs & imaging results that were available during my care of the patient were reviewed by me and considered in my medical decision making (see chart for details).  X-ray show a comminuted fracture of the right proximal radius and although with involvement of the elbow joint. Discussed with Dr. Roland Rack, felt she  would be better served at tertiary center. ----------------------------------------- 4:13 PM on 04/15/2016 -----------------------------------------  Discussed patient with Dr. Rolena Infante with orthopedic surgery at [facility]. Stated he did not feel comfortable accepting patient since he does not do elbows. Recommended [facility] or [facility] hospital.   Discussed with [facility]. Patient is accepted in transfer. Patient was placed in posterior splint.  ____________________________________________   FINAL CLINICAL IMPRESSION(S) / ED DIAGNOSES  Final diagnoses:  Closed fracture of right elbow, initial encounter  Abrasions of multiple sites     Note: This dictation was prepared with Dragon dictation. Any transcriptional errors that result from this process are unintentional    Nance Pear, MD 04/15/16 2257

## 2016-04-15 NOTE — ED Notes (Signed)
Called and re-initiated transfer [phone]

## 2016-04-15 NOTE — ED Notes (Signed)
Patient wheeled out of [facility] by [facility] for transport to [facility]

## 2016-04-16 DIAGNOSIS — Y92099 Unspecified place in other non-institutional residence as the place of occurrence of the external cause: Secondary | ICD-10-CM | POA: Diagnosis not present

## 2016-04-16 DIAGNOSIS — Y92018 Other place in single-family (private) house as the place of occurrence of the external cause: Secondary | ICD-10-CM | POA: Diagnosis not present

## 2016-04-16 DIAGNOSIS — S022XXA Fracture of nasal bones, initial encounter for closed fracture: Secondary | ICD-10-CM | POA: Diagnosis not present

## 2016-04-16 DIAGNOSIS — Z79899 Other long term (current) drug therapy: Secondary | ICD-10-CM | POA: Diagnosis not present

## 2016-04-16 DIAGNOSIS — I10 Essential (primary) hypertension: Secondary | ICD-10-CM | POA: Diagnosis not present

## 2016-04-16 DIAGNOSIS — Z90710 Acquired absence of both cervix and uterus: Secondary | ICD-10-CM | POA: Diagnosis not present

## 2016-04-16 DIAGNOSIS — S52041A Displaced fracture of coronoid process of right ulna, initial encounter for closed fracture: Secondary | ICD-10-CM | POA: Diagnosis not present

## 2016-04-16 DIAGNOSIS — Z043 Encounter for examination and observation following other accident: Secondary | ICD-10-CM | POA: Diagnosis not present

## 2016-04-16 DIAGNOSIS — S52021A Displaced fracture of olecranon process without intraarticular extension of right ulna, initial encounter for closed fracture: Secondary | ICD-10-CM | POA: Diagnosis not present

## 2016-04-16 DIAGNOSIS — N189 Chronic kidney disease, unspecified: Secondary | ICD-10-CM | POA: Diagnosis not present

## 2016-04-16 DIAGNOSIS — S52201A Unspecified fracture of shaft of right ulna, initial encounter for closed fracture: Secondary | ICD-10-CM | POA: Diagnosis not present

## 2016-04-16 DIAGNOSIS — M1990 Unspecified osteoarthritis, unspecified site: Secondary | ICD-10-CM | POA: Diagnosis not present

## 2016-04-16 DIAGNOSIS — M25561 Pain in right knee: Secondary | ICD-10-CM | POA: Diagnosis not present

## 2016-04-16 DIAGNOSIS — K219 Gastro-esophageal reflux disease without esophagitis: Secondary | ICD-10-CM | POA: Diagnosis not present

## 2016-04-16 DIAGNOSIS — S52001A Unspecified fracture of upper end of right ulna, initial encounter for closed fracture: Secondary | ICD-10-CM | POA: Diagnosis not present

## 2016-04-16 DIAGNOSIS — S52121A Displaced fracture of head of right radius, initial encounter for closed fracture: Secondary | ICD-10-CM | POA: Diagnosis not present

## 2016-04-16 DIAGNOSIS — S52101A Unspecified fracture of upper end of right radius, initial encounter for closed fracture: Secondary | ICD-10-CM | POA: Diagnosis not present

## 2016-04-16 DIAGNOSIS — W108XXA Fall (on) (from) other stairs and steps, initial encounter: Secondary | ICD-10-CM | POA: Diagnosis not present

## 2016-04-16 DIAGNOSIS — K2270 Barrett's esophagus without dysplasia: Secondary | ICD-10-CM | POA: Diagnosis not present

## 2016-04-16 DIAGNOSIS — W19XXXA Unspecified fall, initial encounter: Secondary | ICD-10-CM | POA: Diagnosis not present

## 2016-04-16 DIAGNOSIS — Z85828 Personal history of other malignant neoplasm of skin: Secondary | ICD-10-CM | POA: Diagnosis not present

## 2016-04-16 DIAGNOSIS — X58XXXA Exposure to other specified factors, initial encounter: Secondary | ICD-10-CM | POA: Diagnosis not present

## 2016-04-16 DIAGNOSIS — E785 Hyperlipidemia, unspecified: Secondary | ICD-10-CM | POA: Diagnosis not present

## 2016-04-16 DIAGNOSIS — I129 Hypertensive chronic kidney disease with stage 1 through stage 4 chronic kidney disease, or unspecified chronic kidney disease: Secondary | ICD-10-CM | POA: Diagnosis not present

## 2016-04-16 DIAGNOSIS — J9811 Atelectasis: Secondary | ICD-10-CM | POA: Diagnosis not present

## 2016-04-16 DIAGNOSIS — S42401A Unspecified fracture of lower end of right humerus, initial encounter for closed fracture: Secondary | ICD-10-CM | POA: Diagnosis not present

## 2016-04-17 DIAGNOSIS — K219 Gastro-esophageal reflux disease without esophagitis: Secondary | ICD-10-CM | POA: Diagnosis not present

## 2016-04-17 DIAGNOSIS — S42401A Unspecified fracture of lower end of right humerus, initial encounter for closed fracture: Secondary | ICD-10-CM | POA: Diagnosis not present

## 2016-04-17 DIAGNOSIS — S0990XD Unspecified injury of head, subsequent encounter: Secondary | ICD-10-CM | POA: Diagnosis not present

## 2016-04-17 DIAGNOSIS — S52101A Unspecified fracture of upper end of right radius, initial encounter for closed fracture: Secondary | ICD-10-CM | POA: Diagnosis not present

## 2016-04-17 DIAGNOSIS — S52001A Unspecified fracture of upper end of right ulna, initial encounter for closed fracture: Secondary | ICD-10-CM | POA: Diagnosis not present

## 2016-04-17 DIAGNOSIS — I10 Essential (primary) hypertension: Secondary | ICD-10-CM | POA: Diagnosis not present

## 2016-04-20 DIAGNOSIS — K219 Gastro-esophageal reflux disease without esophagitis: Secondary | ICD-10-CM | POA: Diagnosis not present

## 2016-04-20 DIAGNOSIS — S52041A Displaced fracture of coronoid process of right ulna, initial encounter for closed fracture: Secondary | ICD-10-CM | POA: Diagnosis not present

## 2016-04-20 DIAGNOSIS — Z91041 Radiographic dye allergy status: Secondary | ICD-10-CM | POA: Diagnosis not present

## 2016-04-20 DIAGNOSIS — K2270 Barrett's esophagus without dysplasia: Secondary | ICD-10-CM | POA: Diagnosis not present

## 2016-04-20 DIAGNOSIS — S52109A Unspecified fracture of upper end of unspecified radius, initial encounter for closed fracture: Secondary | ICD-10-CM | POA: Diagnosis not present

## 2016-04-20 DIAGNOSIS — S52009A Unspecified fracture of upper end of unspecified ulna, initial encounter for closed fracture: Secondary | ICD-10-CM | POA: Diagnosis not present

## 2016-04-20 DIAGNOSIS — W109XXA Fall (on) (from) unspecified stairs and steps, initial encounter: Secondary | ICD-10-CM | POA: Diagnosis not present

## 2016-04-20 DIAGNOSIS — S52121A Displaced fracture of head of right radius, initial encounter for closed fracture: Secondary | ICD-10-CM | POA: Diagnosis not present

## 2016-04-20 DIAGNOSIS — I129 Hypertensive chronic kidney disease with stage 1 through stage 4 chronic kidney disease, or unspecified chronic kidney disease: Secondary | ICD-10-CM | POA: Diagnosis not present

## 2016-04-20 DIAGNOSIS — K3184 Gastroparesis: Secondary | ICD-10-CM | POA: Diagnosis not present

## 2016-04-20 DIAGNOSIS — Z6841 Body Mass Index (BMI) 40.0 and over, adult: Secondary | ICD-10-CM | POA: Diagnosis not present

## 2016-04-20 DIAGNOSIS — S52001D Unspecified fracture of upper end of right ulna, subsequent encounter for closed fracture with routine healing: Secondary | ICD-10-CM | POA: Diagnosis not present

## 2016-04-20 DIAGNOSIS — S52031A Displaced fracture of olecranon process with intraarticular extension of right ulna, initial encounter for closed fracture: Secondary | ICD-10-CM | POA: Diagnosis not present

## 2016-04-20 DIAGNOSIS — E669 Obesity, unspecified: Secondary | ICD-10-CM | POA: Diagnosis not present

## 2016-04-20 DIAGNOSIS — X58XXXD Exposure to other specified factors, subsequent encounter: Secondary | ICD-10-CM | POA: Diagnosis not present

## 2016-04-20 DIAGNOSIS — N189 Chronic kidney disease, unspecified: Secondary | ICD-10-CM | POA: Diagnosis not present

## 2016-04-20 HISTORY — PX: FRACTURE SURGERY: SHX138

## 2016-04-26 DIAGNOSIS — Z8781 Personal history of (healed) traumatic fracture: Secondary | ICD-10-CM | POA: Diagnosis not present

## 2016-04-26 DIAGNOSIS — S8011XD Contusion of right lower leg, subsequent encounter: Secondary | ICD-10-CM | POA: Diagnosis not present

## 2016-04-26 DIAGNOSIS — R0981 Nasal congestion: Secondary | ICD-10-CM | POA: Diagnosis not present

## 2016-04-26 DIAGNOSIS — S42401D Unspecified fracture of lower end of right humerus, subsequent encounter for fracture with routine healing: Secondary | ICD-10-CM | POA: Diagnosis not present

## 2016-05-04 DIAGNOSIS — M25521 Pain in right elbow: Secondary | ICD-10-CM | POA: Diagnosis not present

## 2016-05-04 DIAGNOSIS — Z4789 Encounter for other orthopedic aftercare: Secondary | ICD-10-CM | POA: Diagnosis not present

## 2016-05-04 DIAGNOSIS — S52001D Unspecified fracture of upper end of right ulna, subsequent encounter for closed fracture with routine healing: Secondary | ICD-10-CM | POA: Diagnosis not present

## 2016-05-04 DIAGNOSIS — S52101D Unspecified fracture of upper end of right radius, subsequent encounter for closed fracture with routine healing: Secondary | ICD-10-CM | POA: Diagnosis not present

## 2016-05-10 ENCOUNTER — Ambulatory Visit: Payer: PPO | Admitting: Occupational Therapy

## 2016-05-10 DIAGNOSIS — Z4789 Encounter for other orthopedic aftercare: Secondary | ICD-10-CM | POA: Diagnosis not present

## 2016-05-10 DIAGNOSIS — M25521 Pain in right elbow: Secondary | ICD-10-CM | POA: Diagnosis not present

## 2016-05-17 ENCOUNTER — Ambulatory Visit
Admission: RE | Admit: 2016-05-17 | Discharge: 2016-05-17 | Disposition: A | Discharge status: Home / Self Care | Payer: PPO | Source: Ambulatory Visit | Attending: Physician Assistant | Admitting: Physician Assistant

## 2016-05-17 ENCOUNTER — Other Ambulatory Visit: Payer: Self-pay | Admitting: Physician Assistant

## 2016-05-17 DIAGNOSIS — I82412 Acute embolism and thrombosis of left femoral vein: Secondary | ICD-10-CM | POA: Insufficient documentation

## 2016-05-17 DIAGNOSIS — I82402 Acute embolism and thrombosis of unspecified deep veins of left lower extremity: Secondary | ICD-10-CM | POA: Diagnosis not present

## 2016-05-17 DIAGNOSIS — R600 Localized edema: Secondary | ICD-10-CM | POA: Diagnosis not present

## 2016-05-17 DIAGNOSIS — I82403 Acute embolism and thrombosis of unspecified deep veins of lower extremity, bilateral: Secondary | ICD-10-CM | POA: Insufficient documentation

## 2016-05-17 DIAGNOSIS — I82441 Acute embolism and thrombosis of right tibial vein: Secondary | ICD-10-CM | POA: Insufficient documentation

## 2016-05-17 DIAGNOSIS — I82401 Acute embolism and thrombosis of unspecified deep veins of right lower extremity: Secondary | ICD-10-CM | POA: Diagnosis not present

## 2016-05-19 DIAGNOSIS — S52001D Unspecified fracture of upper end of right ulna, subsequent encounter for closed fracture with routine healing: Secondary | ICD-10-CM | POA: Diagnosis not present

## 2016-05-19 DIAGNOSIS — M25521 Pain in right elbow: Secondary | ICD-10-CM | POA: Diagnosis not present

## 2016-05-19 DIAGNOSIS — S52101D Unspecified fracture of upper end of right radius, subsequent encounter for closed fracture with routine healing: Secondary | ICD-10-CM | POA: Diagnosis not present

## 2016-05-19 DIAGNOSIS — Y33XXXD Other specified events, undetermined intent, subsequent encounter: Secondary | ICD-10-CM | POA: Diagnosis not present

## 2016-05-26 DIAGNOSIS — I824Y3 Acute embolism and thrombosis of unspecified deep veins of proximal lower extremity, bilateral: Secondary | ICD-10-CM | POA: Diagnosis not present

## 2016-05-31 ENCOUNTER — Ambulatory Visit: Payer: PPO | Attending: Student | Admitting: Occupational Therapy

## 2016-05-31 DIAGNOSIS — M25631 Stiffness of right wrist, not elsewhere classified: Secondary | ICD-10-CM | POA: Insufficient documentation

## 2016-05-31 DIAGNOSIS — M6281 Muscle weakness (generalized): Secondary | ICD-10-CM | POA: Diagnosis not present

## 2016-05-31 DIAGNOSIS — R200 Anesthesia of skin: Secondary | ICD-10-CM | POA: Diagnosis not present

## 2016-05-31 DIAGNOSIS — R202 Paresthesia of skin: Secondary | ICD-10-CM | POA: Insufficient documentation

## 2016-05-31 DIAGNOSIS — M25621 Stiffness of right elbow, not elsewhere classified: Secondary | ICD-10-CM | POA: Diagnosis not present

## 2016-05-31 DIAGNOSIS — M25641 Stiffness of right hand, not elsewhere classified: Secondary | ICD-10-CM | POA: Diagnosis not present

## 2016-05-31 NOTE — Therapy (Signed)
[facility] [address] [place], [address] Phone: [phone]   Fax:  [phone]  Occupational Therapy Evaluation  Patient Details  Name: Megan Hodge MRN: [id number] Date of Birth: 1945-11-03 Referring Provider: Celesta Aver  Encounter Date: 05/31/2016      OT End of Session - 05/31/16 1736    Visit Number 1   Number of Visits 18   Date for OT Re-Evaluation 07/12/16   OT Start Time [id number]   OT Stop Time 1415   OT Time Calculation (min) 68 min   Activity Tolerance Patient tolerated treatment well   Behavior During Therapy [facility] for tasks assessed/performed      Past Medical History:  Diagnosis Date  . Abnormal mammogram, unspecified    right breast  . Cancer ([place]) 1968   cervical cancer  . Hypertension 1995    Past Surgical History:  Procedure Laterality Date  . ABDOMINAL HYSTERECTOMY  1993  . APPENDECTOMY  1993  . BREAST BIOPSY Right 1992  . BREAST BIOPSY Right March 2014   Atypical ductal hyperplasia. Failed trial of tamoxifen for chemoprevention.  . COLONOSCOPY  08/2014   Dr. Vira Agar  . DILATION AND CURETTAGE OF UTERUS  1968    There were no vitals filed for this visit.      Subjective Assessment - 05/31/16 1313    Subjective  Fell at my back steps- broked my elbow, hit my face and broked bone in my nose - I am R hand dominant - Cast for about 2 wks  , then hinge splint since 24 Oct - had 3 sessions therapy - going back 20 th Dec to see surgeon   Patient Stated Goals Want to use my R dominant hand like prior to ROM - brushing teeth, eating , cutting food, phone , fixing hair,  bathing , dressing    Currently in Pain? No/denies   Pain Score 4    Pain Orientation Right   Pain Descriptors / Indicators Numbness   Pain Type Surgical pain   Pain Onset More than a month ago           [facility] OT Assessment - 05/31/16 0001      Assessment   Diagnosis s/pORIF olecranon fx and radial head  replacement   Referring Provider Celesta Aver   Onset Date 04/20/16   Prior Therapy --  3 sesssions of OT in past     Precautions   Required Braces or Orthoses --  elbow hinge brace when out and about     Balance Screen   Has the patient fallen in the past 6 months Yes   How many times? 1   Has the patient had a decrease in activity level because of a fear of falling?  No   Is the patient reluctant to leave their home because of a fear of falling?  No     Home  Environment   Lives With Spouse     Prior Function   Vocation Retired   Leisure R hand dominant , like to built puzzles,  some yard working,  house work , cooking,      AROM   Right Elbow Flexion 110   Right Elbow Extension -55   Right Forearm Pronation 45 Degrees   Right Forearm Supination 45 Degrees   Right Wrist Extension 0 Degrees   Right Wrist Flexion 50 Degrees   Right Wrist Radial Deviation 10 Degrees   Right Wrist Ulnar Deviation  30 Degrees     Right Hand AROM   R Index  MCP 0-90 70 Degrees   R Index PIP 0-100 80 Degrees   R Long  MCP 0-90 75 Degrees   R Long PIP 0-100 80 Degrees   R Ring  MCP 0-90 70 Degrees   R Ring PIP 0-100 90 Degrees   R Little  MCP 0-90 65 Degrees   R Little PIP 0-100 90 Degrees     Fluidodherapy done for AROM for digits and wrist at [facility] - to increase ROM and decrease sitffness  heatingpad on elbow - review HEP for wrist , digits and elbow with pt                     OT Education - 05/31/16 1736    Education provided Yes   Education Details findings of eval and HEP    Person(s) Educated Patient   Methods Explanation;Demonstration;Tactile cues;Verbal cues;Handout   Comprehension Verbal cues required;Returned demonstration;Verbalized understanding          OT Short Term Goals - 05/31/16 1751      OT SHORT TERM GOAL #1   Title R hand digits flexion able to touch palm to hold toothbrush and  utencils   Baseline Digits flexion at [facility] 's and PIP impaired , and MC   extention    Time 3   Period Weeks   Status New     OT SHORT TERM GOAL #2   Title Pt independing in HEP to decrease scar tissue, increase  digits and wrist AROM to turn doorknob, do puzzles, wash dishes   Baseline see flowsheet    Time 3   Period Weeks   Status New     OT SHORT TERM GOAL #3   Title R wrist sup/pro and wrist extention improve with more than30 degrees to turn doorknob and pages, use sweeper duster   Baseline 45 degrees sup/pro; wrist ext 0 degrees   Time 3   Period Weeks   Status New           OT Long Term Goals - 05/31/16 1757      OT LONG TERM GOAL #1   Title R elbow extention improve with more than 30 degrees  to  tie shoes and reach in pocket   Baseline extention -55;    Time 5   Period Weeks   Status New     OT LONG TERM GOAL #2   Title R elbow flexion improve with with more than 20 degrees to do hair, apply deodorant, do jewelry    Baseline R elbow flexion 110   Time 5   Period Weeks   Status New     OT LONG TERM GOAL #3   Title R grip strength improve for pt to have more than 50% grip compare to L hand    Baseline NT - 6 wks s/p   Time 6   Period Weeks   Status New     OT LONG TERM GOAL #4   Title Function on PREE improve more than 20 points    Baseline PREE function score 49/50   Time 5   Period Weeks   Status New               Plan - 05/31/16 1737    Clinical Impression Statement Pt present 6 wks s/p R ORIF olecranon fx and radial head replacement- pt arrive with elbow hinge brace on - that  she is wearing when out and about - pt show extreme stiffness in wrist and digits extention , digits flexion , sup/pronation - pt at -55 elbow extention and 110 flexion - scar adhere and thick - and pin size opening still  in middle of scar - also report numbness on ulnar side of forearm and 4th/5th digits - limitiing her functional use of R dominant hand and arm    Rehab Potential Good   OT Frequency 3x / week   OT Duration 6 weeks   OT  Treatment/Interventions Self-care/ADL training;Moist Heat;Fluidtherapy;Splinting;Patient/family education;Therapeutic exercises;Contrast Bath;Ultrasound;Scar mobilization;Passive range of motion;Parrafin;Manual Therapy   Plan assess how doing with HEP   OT Home Exercise Plan see pt instruction    Consulted and Agree with Plan of Care Patient      Patient will benefit from skilled therapeutic intervention in order to improve the following deficits and impairments:  Decreased range of motion, Impaired flexibility, Increased edema, Decreased knowledge of precautions, Impaired UE functional use, Pain, Decreased strength  Visit Diagnosis: Stiffness of right elbow, not elsewhere classified - Plan: Ot plan of care cert/re-cert  Stiffness of right hand, not elsewhere classified - Plan: Ot plan of care cert/re-cert  Stiffness of right wrist, not elsewhere classified - Plan: Ot plan of care cert/re-cert  Numbness and tingling of hand - Plan: Ot plan of care cert/re-cert  Muscle weakness (generalized) - Plan: Ot plan of care cert/re-cert    Problem List Patient Active Problem List   Diagnosis Date Noted  . 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 Atypical ductal hyperplasia, breast 09/26/2012    Class: Diagnosis of    Rosalyn Gess OTR/L;CLT  05/31/2016, 6:09 PM  [facility] [address] [place], [address] Phone: [phone]   Fax:  [phone]  Name: Megan Hodge MRN: [id number] Date of Birth: 02/23/1946

## 2016-05-31 NOTE — Patient Instructions (Signed)
Contrast  Tendon glides PROM composite fist  Place and hold  AROM full fist to 2 cm foam block  Digits extention  Opposition   PROM wrist extention  AROM ext and flexion of wrist   UD and RD AROM wrist  PROM sup/pro   Elbow supine heat  Massage over bicep  AAROM flexion and extention - separate sessions  Reaching to hip And reaching touching chin, nose and ear for flexion

## 2016-06-07 ENCOUNTER — Ambulatory Visit: Payer: PPO | Admitting: Occupational Therapy

## 2016-06-07 DIAGNOSIS — M25621 Stiffness of right elbow, not elsewhere classified: Secondary | ICD-10-CM | POA: Diagnosis not present

## 2016-06-07 DIAGNOSIS — M25641 Stiffness of right hand, not elsewhere classified: Secondary | ICD-10-CM

## 2016-06-07 DIAGNOSIS — R202 Paresthesia of skin: Secondary | ICD-10-CM

## 2016-06-07 DIAGNOSIS — R200 Anesthesia of skin: Secondary | ICD-10-CM

## 2016-06-07 DIAGNOSIS — M25631 Stiffness of right wrist, not elsewhere classified: Secondary | ICD-10-CM

## 2016-06-07 DIAGNOSIS — M6281 Muscle weakness (generalized): Secondary | ICD-10-CM

## 2016-06-07 NOTE — Therapy (Signed)
[place] [facility] [address] [place], [address] Phone: [phone]   Fax:  [phone]  Occupational Therapy Treatment  Patient Details  Name: Megan Hodge MRN: [id number] Date of Birth: [birth year] Referring Provider: Celesta Aver  Encounter Date: 06/07/2016      OT End of Session - 06/07/16 1320    Visit Number 2   Number of Visits 18   Date for OT Re-Evaluation 07/12/16   OT Start Time 1155   OT Stop Time 1304   OT Time Calculation (min) 69 min   Activity Tolerance Patient tolerated treatment well   Behavior During Therapy [facility] for tasks assessed/performed      Past Medical History:  Diagnosis Date  . Abnormal mammogram, unspecified    right breast  . Cancer ([place]) 1968   cervical cancer  . Hypertension 1995    Past Surgical History:  Procedure Laterality Date  . ABDOMINAL HYSTERECTOMY  1993  . APPENDECTOMY  1993  . BREAST BIOPSY Right 1992  . BREAST BIOPSY Right March 2014   Atypical ductal hyperplasia. Failed trial of tamoxifen for chemoprevention.  . COLONOSCOPY  08/2014   Dr. Vira Agar  . DILATION AND CURETTAGE OF UTERUS  1968    There were no vitals filed for this visit.      Subjective Assessment - 06/07/16 1210    Subjective  I did the exercises- about 2 x day - did not do to great - my wrist feels like it do not want to go back - hanging- exercises hardest was getting my hand or fingers off the table    Patient Stated Goals Want to use my R dominant hand like prior to ROM - brushing teeth, eating , cutting food, phone , fixing hair,  bathing , dressing    Currently in Pain? No/denies                      OT Treatments/Exercises (OP) - 06/07/16 0001      Moist Heat Therapy   Number Minutes Moist Heat 10 Minutes   Moist Heat Location Elbow     RUE Paraffin   Number Minutes Paraffin 10 Minutes   RUE Paraffin Location Hand   Comments at [facility] during heat to elbow for  extention stretch - paraffin to hand to increase wrist extention and digits flexion       Splinting   Splinting Fitted with prefab wrist splint to wear to decrease wrist drop during day       After parafin to R hand - elbow extention - adjusted 2 x to increase extention stretch using large heating pad at [place] nr 2 and 4 use for volar forearm , wrist , distal upper arm in combination with wrist and elbow extention stretch   done sweeping and scooping - tight extremely in volar wrist , and over distal bicep   PROM for wrist extention -and prolonged extention stretch in combination digits extention  Supination and pronation wheel done - 20 reps each and then together between the 2  Add to HEP and pt to borrow wheel- needed min A and mod v/c not to compensate with leaning and elbow ABD  Myofascial release stretch and rhitmic stretch into elbow extention - in supine  10 reps each  Husband taught to help pt after heat at home  Pt self stretch against wall for extention -and taught to do at home  AROM  into extention standing against wall - pushing into 6inch ball Can do at home  Did not focus this date on elbow flexion - but did ed pt to doing against wall leaning in for elbow flexion stretch   Fit with wrist prefab splint to wear under elbow splint to decrease hanging of wrist into flexion             OT Education - 06/07/16 1320    Education provided Yes   Education Details changes to [employer]) Educated Patient;Spouse   Methods Explanation;Demonstration;Tactile cues;Verbal cues   Comprehension Verbal cues required;Returned demonstration;Verbalized understanding          OT Short Term Goals - 05/31/16 1751      OT SHORT TERM GOAL #1   Title R hand digits flexion able to touch palm to hold toothbrush and  utencils   Baseline Digits flexion at [facility] 's and PIP impaired , and MC  extention    Time 3   Period Weeks   Status New     OT SHORT TERM GOAL #2    Title Pt independing in HEP to decrease scar tissue, increase  digits and wrist AROM to turn doorknob, do puzzles, wash dishes   Baseline see flowsheet    Time 3   Period Weeks   Status New     OT SHORT TERM GOAL #3   Title R wrist sup/pro and wrist extention improve with more than30 degrees to turn doorknob and pages, use sweeper duster   Baseline 45 degrees sup/pro; wrist ext 0 degrees   Time 3   Period Weeks   Status New           OT Long Term Goals - 05/31/16 1757      OT LONG TERM GOAL #1   Title R elbow extention improve with more than 30 degrees  to  tie shoes and reach in pocket   Baseline extention -55;    Time 5   Period Weeks   Status New     OT LONG TERM GOAL #2   Title R elbow flexion improve with with more than 20 degrees to do hair, apply deodorant, do jewelry    Baseline R elbow flexion 110   Time 5   Period Weeks   Status New     OT LONG TERM GOAL #3   Title R grip strength improve for pt to have more than 50% grip compare to L hand    Baseline NT - 6 wks s/p   Time 6   Period Weeks   Status New     OT LONG TERM GOAL #4   Title Function on PREE improve more than 20 points    Baseline PREE function score 49/50   Time 5   Period Weeks   Status New               Plan - 06/07/16 1321    Clinical Impression Statement Pt cont to show extreme stiffness in wrist and digits extention - as well as elbow -focus this date on elbow extention , sup/pro , soft tissue mobs to increase wrist , digits and elbow extention    Rehab Potential Good   OT Frequency 3x / week   OT Duration 6 weeks   OT Treatment/Interventions Self-care/ADL training;Moist Heat;Fluidtherapy;Splinting;Patient/family education;Therapeutic exercises;Contrast Bath;Ultrasound;Scar mobilization;Passive range of motion;Parrafin;Manual Therapy   Plan check on progress with HEP - focus on flexion of elbow   OT Home  Exercise Plan see pt instruction    Consulted and Agree with Plan of  Care Patient      Patient will benefit from skilled therapeutic intervention in order to improve the following deficits and impairments:  Decreased range of motion, Impaired flexibility, Increased edema, Decreased knowledge of precautions, Impaired UE functional use, Pain, Decreased strength  Visit Diagnosis: Stiffness of right elbow, not elsewhere classified  Stiffness of right hand, not elsewhere classified  Stiffness of right wrist, not elsewhere classified  Numbness and tingling of hand  Muscle weakness (generalized)    Problem List Patient Active Problem List   Diagnosis Date Noted  . 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 [place], [address] Phone: [phone]   Fax:  [phone]  Name: Megan Hodge MRN: [id number] Date of Birth: 04-19-1946

## 2016-06-07 NOTE — Patient Instructions (Addendum)
Pt ed on use of Supination and pronation wheel  - 20 reps each and then together between the 2   not to compensate with leaning and elbow ABD  Husband to help with Myofascial release stretch and rhitmic stretch into elbow extention - in supine  10 reps each  Husband taught to help pt after heat at home  Pt self stretch against wall for extention -and taught to do at home  AROM into extention standing against wall - pushing into 6inch ball Can do at home  Fit with wrist prefab splint to wear under elbow splint to decrease hanging of wrist into flexion

## 2016-06-09 ENCOUNTER — Ambulatory Visit: Payer: PPO | Admitting: Occupational Therapy

## 2016-06-09 DIAGNOSIS — M6281 Muscle weakness (generalized): Secondary | ICD-10-CM

## 2016-06-09 DIAGNOSIS — M25631 Stiffness of right wrist, not elsewhere classified: Secondary | ICD-10-CM

## 2016-06-09 DIAGNOSIS — M25621 Stiffness of right elbow, not elsewhere classified: Secondary | ICD-10-CM | POA: Diagnosis not present

## 2016-06-09 DIAGNOSIS — R202 Paresthesia of skin: Secondary | ICD-10-CM

## 2016-06-09 DIAGNOSIS — R200 Anesthesia of skin: Secondary | ICD-10-CM

## 2016-06-09 DIAGNOSIS — M25641 Stiffness of right hand, not elsewhere classified: Secondary | ICD-10-CM

## 2016-06-09 NOTE — Therapy (Signed)
[place] [facility] [address], [place], [address] Phone: [phone]   Fax:  [phone]  Occupational Therapy Treatment  Patient Details  Name: Megan Hodge MRN: [id number] Date of Birth: 05/19/1946 Referring Provider: Celesta Aver  Encounter Date: 06/09/2016      OT End of Session - 06/09/16 2053    Visit Number 3   Number of Visits 18   Date for OT Re-Evaluation 07/12/16   OT Start Time 1211   OT Stop Time 1308   OT Time Calculation (min) 57 min   Activity Tolerance Patient tolerated treatment well   Behavior During Therapy [facility] for tasks assessed/performed      Past Medical History:  Diagnosis Date  . Abnormal mammogram, unspecified    right breast  . Cancer ([place]) 1968   cervical cancer  . Hypertension 1995    Past Surgical History:  Procedure Laterality Date  . ABDOMINAL HYSTERECTOMY  1993  . APPENDECTOMY  1993  . BREAST BIOPSY Right 1992  . BREAST BIOPSY Right March 2014   Atypical ductal hyperplasia. Failed trial of tamoxifen for chemoprevention.  . COLONOSCOPY  08/2014   Dr. Vira Agar  . DILATION AND CURETTAGE OF UTERUS  1968    There were no vitals filed for this visit.      Subjective Assessment - 06/09/16 1227    Subjective  Did okay - my husband tried to help - tried to set my splint to give me little pull - my hand just get so tight - so easy - feels like the heat makes it worse    Patient Stated Goals Want to use my R dominant hand like prior to ROM - brushing teeth, eating , cutting food, phone , fixing hair,  bathing , dressing    Currently in Pain? No/denies                      OT Treatments/Exercises (OP) - 06/09/16 0001      Moist Heat Therapy   Number Minutes Moist Heat 10 Minutes   Moist Heat Location Elbow  in  flexion  stretch with heatinpad     RUE Fluidotherapy   Number Minutes Fluidotherapy 10 Minutes   RUE Fluidotherapy Location Hand;Wrist   Comments Hand and wrist AROM  at [facility] to decrease stiffness -       After fluido  Done MC flexion , intrinsic fist blocked AROM   composite flexion PROM each digit  AROM to 4 cm foam block  AROM full fist to 2 cm foam block - needed place and hold  10 reps each  Pt needed mod A to do correctly  Sup/pro wheel on table done each 20 reps  BTE CPM for wrist extention 200 sec   but had to face tool because of unable to do pronation  Used heatinpad for elbow flexion stretch  AAROM elbow flexion  Contract relax 10 reps  AAROM touching R ear 10 reps  kinesiotape scar - 20% pull parallel to scar , and 4 tracks cross over at 100% pull  Pt to keep on and watch for any allergic reaction             OT Education - 06/09/16 2053    Education provided Yes   Education Details HEP update and reviewed   Person(s) Educated Patient   Methods Explanation;Demonstration;Tactile cues;Verbal cues;Handout   Comprehension Verbal cues required;Returned demonstration;Verbalized understanding  OT Short Term Goals - 05/31/16 1751      OT SHORT TERM GOAL #1   Title R hand digits flexion able to touch palm to hold toothbrush and  utencils   Baseline Digits flexion at [facility] 's and PIP impaired , and MC  extention    Time 3   Period Weeks   Status New     OT SHORT TERM GOAL #2   Title Pt independing in HEP to decrease scar tissue, increase  digits and wrist AROM to turn doorknob, do puzzles, wash dishes   Baseline see flowsheet    Time 3   Period Weeks   Status New     OT SHORT TERM GOAL #3   Title R wrist sup/pro and wrist extention improve with more than30 degrees to turn doorknob and pages, use sweeper duster   Baseline 45 degrees sup/pro; wrist ext 0 degrees   Time 3   Period Weeks   Status New           OT Long Term Goals - 05/31/16 1757      OT LONG TERM GOAL #1   Title R elbow extention improve with more than 30 degrees  to  tie shoes and reach in pocket   Baseline  extention -55;    Time 5   Period Weeks   Status New     OT LONG TERM GOAL #2   Title R elbow flexion improve with with more than 20 degrees to do hair, apply deodorant, do jewelry    Baseline R elbow flexion 110   Time 5   Period Weeks   Status New     OT LONG TERM GOAL #3   Title R grip strength improve for pt to have more than 50% grip compare to L hand    Baseline NT - 6 wks s/p   Time 6   Period Weeks   Status New     OT LONG TERM GOAL #4   Title Function on PREE improve more than 20 points    Baseline PREE function score 49/50   Time 5   Period Weeks   Status New               Plan - 06/09/16 2054    Clinical Impression Statement Pt cont to show severe stiffness in digits ,wrist extention , sup/or and elbow flexion and extnetoin - did show good progress in session with digits flexion after fluido and PROM - reinforce importance of HEP    Rehab Potential Good   OT Frequency 3x / week   OT Duration 6 weeks   OT Treatment/Interventions Self-care/ADL training;Moist Heat;Fluidtherapy;Splinting;Patient/family education;Therapeutic exercises;Contrast Bath;Ultrasound;Scar mobilization;Passive range of motion;Parrafin;Manual Therapy   Plan assess progress in digits   OT Home Exercise Plan see pt instruction    Consulted and Agree with Plan of Care Patient      Patient will benefit from skilled therapeutic intervention in order to improve the following deficits and impairments:  Decreased range of motion, Impaired flexibility, Increased edema, Decreased knowledge of precautions, Impaired UE functional use, Pain, Decreased strength  Visit Diagnosis: Stiffness of right elbow, not elsewhere classified  Stiffness of right hand, not elsewhere classified  Stiffness of right wrist, not elsewhere classified  Numbness and tingling of hand  Muscle weakness (generalized)    Problem List Patient Active Problem List   Diagnosis Date Noted  . Atypical ductal hyperplasia  of breast 09/25/2014  . Atypical ductal hyperplasia, breast 09/26/2012  Class: Diagnosis of    Rosalyn Gess OTR/L,CLT 06/09/2016, 8:56 PM  [place] [facility] [address] [place], [address] Phone: [phone]   Fax:  [phone]  Name: Megan Hodge MRN: [id number] Date of Birth: 28-Mar-1946

## 2016-06-09 NOTE — Patient Instructions (Signed)
Reinforce   MC flexion , intrinsic fist blocked AROM   composite flexion PROM each digit  AROM to 4 cm foam block  AROM full fist to 2 cm foam block - needed place and hold  10 reps each  Pt needed mod A to do correctly  Sup/pro wheel on table  20 reps  Cont with PROM and AROM for elbow as before  kinesiotape scar - 20% pull parallel to scar , and 4 tracks cross over at 100% pull  Pt to keep on and watch for any allergic reaction

## 2016-06-11 ENCOUNTER — Ambulatory Visit: Payer: PPO | Attending: Orthopedic Surgery | Admitting: Occupational Therapy

## 2016-06-11 DIAGNOSIS — M6281 Muscle weakness (generalized): Secondary | ICD-10-CM | POA: Diagnosis not present

## 2016-06-11 DIAGNOSIS — M25621 Stiffness of right elbow, not elsewhere classified: Secondary | ICD-10-CM | POA: Diagnosis not present

## 2016-06-11 DIAGNOSIS — R200 Anesthesia of skin: Secondary | ICD-10-CM | POA: Insufficient documentation

## 2016-06-11 DIAGNOSIS — M25641 Stiffness of right hand, not elsewhere classified: Secondary | ICD-10-CM | POA: Insufficient documentation

## 2016-06-11 DIAGNOSIS — M25631 Stiffness of right wrist, not elsewhere classified: Secondary | ICD-10-CM | POA: Insufficient documentation

## 2016-06-11 DIAGNOSIS — R202 Paresthesia of skin: Secondary | ICD-10-CM | POA: Insufficient documentation

## 2016-06-11 NOTE — Patient Instructions (Addendum)
Same HEP for digits flexion  Sup/pro wheel on table done each 20 reps   Cont with PROM , stretches for elbow flexion and extention - after heat -  Can hold it into stretch and use elbow splint when watching tv Husband taught to help pt after heat at home - but afraid he will hurt her Pt self stretch against wall for extention -and taught to do at home again  kinesiotape scar - 20% pull parallel to scar , and 4 tracks cross over at 100% pull  Pt to keep on and watch for any allergic reaction

## 2016-06-11 NOTE — Therapy (Signed)
[place] [facility] [address] [place], [address] Phone: [phone]   Fax:  [phone]  Occupational Therapy Treatment  Patient Details  Name: Megan Hodge MRN: [id number] Date of Birth: 01-26-1946 Referring Provider: Celesta Aver  Encounter Date: 06/11/2016      OT End of Session - 06/11/16 1539    Visit Number 4   Number of Visits 18   Date for OT Re-Evaluation 07/12/16   OT Start Time 1244   OT Stop Time 1400   OT Time Calculation (min) 76 min   Activity Tolerance Patient tolerated treatment well   Behavior During Therapy [facility] for tasks assessed/performed      Past Medical History:  Diagnosis Date  . Abnormal mammogram, unspecified    right breast  . Cancer ([place]) 1968   cervical cancer  . Hypertension 1995    Past Surgical History:  Procedure Laterality Date  . ABDOMINAL HYSTERECTOMY  1993  . APPENDECTOMY  1993  . BREAST BIOPSY Right 1992  . BREAST BIOPSY Right March 2014   Atypical ductal hyperplasia. Failed trial of tamoxifen for chemoprevention.  . COLONOSCOPY  08/2014   Dr. Vira Agar  . DILATION AND CURETTAGE OF UTERUS  1968    There were no vitals filed for this visit.      Subjective Assessment - 06/11/16 1533    Subjective  My hand is the best when I get up in am and then it gets more stiff - and swollen - I watch a lot of tv - keep most of the time my hand on my chest - I do my exercises about 2 x day   Patient Stated Goals Want to use my R dominant hand like prior to ROM - brushing teeth, eating , cutting food, phone , fixing hair,  bathing , dressing    Currently in Pain? No/denies                      OT Treatments/Exercises (OP) - 06/11/16 0001      RUE Fluidotherapy   Number Minutes Fluidotherapy 10 Minutes   RUE Fluidotherapy Location Hand;Wrist   Comments AROM for wrist and digits  at [facility] to  increase  ROM  and decrease pain and stiffness     Removed  kinesiotape scar moving better  After fluido  Done MC flexion , intrinsic fist blocked AROM   composite flexion PROM each digit  AAROM to 2 cm foam block - needed place and hold  10 reps each  Pt needed mod A to do correctly still   Sup/pro wheel on table done each 20 reps  Each and PROM prior by OT - pt compensate with elbow ABD or trunk  BTE CPM for wrist extention 200 sec   but had to face tool because of unable to do pronation   After fluido R hand - elbow extention - adjusted 2 x to increase extention stretch using large heating pad at [facility] - double folded pillow under hand - then down to one and 1/2  [place] tools nr 2 and 4 use for volar forearm , wrist , distal upper  Upper arm in combination with wrist and elbow extention stretch   done sweeping and scooping - tight extremely in volar wrist , and over distal bicep   PROM for wrist extention -and prolonged extention stretch in combination digits extention  contract and relax elbow extention  myofascial release stretch and rhitmic  stretch into elbow extention - in supine  10 reps each  Husband taught to help pt after heat at home - but afraid he will hurt her Pt self stretch against wall for extention -and taught to do at home again UBE for 3 min - FW - compensate with trunk movement Did not focus this date on elbow flexion   kinesiotape scar - 20% pull parallel to scar , and 4 tracks cross over at 100% pull  Pt to keep on and watch for any allergic reaction             OT Education - 06/11/16 1538    Education provided Yes   Education Details HEP reinforce again HEP and to do it more    Person(s) Educated Patient   Methods Explanation;Demonstration;Tactile cues;Verbal cues   Comprehension Verbal cues required;Returned demonstration;Verbalized understanding          OT Short Term Goals - 05/31/16 1751      OT SHORT TERM GOAL #1   Title R hand digits flexion able to touch palm to hold toothbrush and   utencils   Baseline Digits flexion at [facility] 's and PIP impaired , and MC  extention    Time 3   Period Weeks   Status New     OT SHORT TERM GOAL #2   Title Pt independing in HEP to decrease scar tissue, increase  digits and wrist AROM to turn doorknob, do puzzles, wash dishes   Baseline see flowsheet    Time 3   Period Weeks   Status New     OT SHORT TERM GOAL #3   Title R wrist sup/pro and wrist extention improve with more than30 degrees to turn doorknob and pages, use sweeper duster   Baseline 45 degrees sup/pro; wrist ext 0 degrees   Time 3   Period Weeks   Status New           OT Long Term Goals - 05/31/16 1757      OT LONG TERM GOAL #1   Title R elbow extention improve with more than 30 degrees  to  tie shoes and reach in pocket   Baseline extention -55;    Time 5   Period Weeks   Status New     OT LONG TERM GOAL #2   Title R elbow flexion improve with with more than 20 degrees to do hair, apply deodorant, do jewelry    Baseline R elbow flexion 110   Time 5   Period Weeks   Status New     OT LONG TERM GOAL #3   Title R grip strength improve for pt to have more than 50% grip compare to L hand    Baseline NT - 6 wks s/p   Time 6   Period Weeks   Status New     OT LONG TERM GOAL #4   Title Function on PREE improve more than 20 points    Baseline PREE function score 49/50   Time 5   Period Weeks   Status New               Plan - 06/11/16 1539    Clinical Impression Statement Pt admit that she do not do her exercises as she should - watch tv  a lot - reinforce importance  of HEP and to do frequently - and while watching tv , do PROM and stretch elbow ext, flexion - even use splint to lock it  into stretch position    Rehab Potential Good   OT Frequency 3x / week   OT Duration 6 weeks   OT Treatment/Interventions Self-care/ADL training;Moist Heat;Fluidtherapy;Splinting;Patient/family education;Therapeutic exercises;Contrast Bath;Ultrasound;Scar  mobilization;Passive range of motion;Parrafin;Manual Therapy   Plan progress in elbow assess -    OT Home Exercise Plan see pt instruction    Consulted and Agree with Plan of Care Patient      Patient will benefit from skilled therapeutic intervention in order to improve the following deficits and impairments:  Decreased range of motion, Impaired flexibility, Increased edema, Decreased knowledge of precautions, Impaired UE functional use, Pain, Decreased strength  Visit Diagnosis: Stiffness of right elbow, not elsewhere classified  Stiffness of right hand, not elsewhere classified  Stiffness of right wrist, not elsewhere classified  Numbness and tingling of hand  Muscle weakness (generalized)    Problem List Patient Active Problem List   Diagnosis Date Noted  . 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 Atypical ductal hyperplasia, breast 09/26/2012    Class: Diagnosis of     Rosalyn Gess OTR/L,CLT 06/11/2016, 3:51 PM  [place] [facility] [address], [place], [address] Phone: [phone]   Fax:  [phone]  Name: Megan Hodge MRN: [id number] Date of Birth: 06/16/1946

## 2016-06-15 ENCOUNTER — Ambulatory Visit: Payer: PPO | Admitting: Occupational Therapy

## 2016-06-15 DIAGNOSIS — M25621 Stiffness of right elbow, not elsewhere classified: Secondary | ICD-10-CM | POA: Diagnosis not present

## 2016-06-15 DIAGNOSIS — R202 Paresthesia of skin: Secondary | ICD-10-CM

## 2016-06-15 DIAGNOSIS — M25641 Stiffness of right hand, not elsewhere classified: Secondary | ICD-10-CM

## 2016-06-15 DIAGNOSIS — R200 Anesthesia of skin: Secondary | ICD-10-CM

## 2016-06-15 DIAGNOSIS — M25631 Stiffness of right wrist, not elsewhere classified: Secondary | ICD-10-CM

## 2016-06-15 DIAGNOSIS — M6281 Muscle weakness (generalized): Secondary | ICD-10-CM

## 2016-06-15 NOTE — Therapy (Signed)
[place] [facility] [address], [place], [address] Phone: [phone]   Fax:  [phone]  Occupational Therapy Treatment  Patient Details  Name: Megan Hodge MRN: [id number] Date of Birth: [birth year] Referring Provider: Celesta Aver  Encounter Date: 06/15/2016      OT End of Session - 06/15/16 1421    Visit Number 5   Number of Visits 18   Date for OT Re-Evaluation 07/12/16   OT Start Time 0920   OT Stop Time 1026   OT Time Calculation (min) 66 min   Activity Tolerance Patient tolerated treatment well   Behavior During Therapy [facility] for tasks assessed/performed      Past Medical History:  Diagnosis Date  . Abnormal mammogram, unspecified    right breast  . Cancer ([place]) 1968   cervical cancer  . Hypertension 1995    Past Surgical History:  Procedure Laterality Date  . ABDOMINAL HYSTERECTOMY  1993  . APPENDECTOMY  1993  . BREAST BIOPSY Right 1992  . BREAST BIOPSY Right March 2014   Atypical ductal hyperplasia. Failed trial of tamoxifen for chemoprevention.  . COLONOSCOPY  08/2014   Dr. Vira Agar  . DILATION AND CURETTAGE OF UTERUS  1968    There were no vitals filed for this visit.      Subjective Assessment - 06/15/16 1410    Subjective  My hand and wrist stays stiff- and as soon as I get up my hand gets swollen - elbow was sore after last time for couple of days    Patient Stated Goals Want to use my R dominant hand like prior to ROM - brushing teeth, eating , cutting food, phone , fixing hair,  bathing , dressing    Currently in Pain? No/denies                      OT Treatments/Exercises (OP) - 06/15/16 0001      RUE Paraffin   Number Minutes Paraffin 10 Minutes   RUE Paraffin Location --  elbow   Comments at [facility] to increase ROM  - done at [facility] with heatingpad around      [place]   Number Minutes Fluidotherapy 10 Minutes   RUE Fluidotherapy Location Hand;Wrist   Comments AROM at [facility] to  increase digits ROM anddecrease stiffness         Kinesiotape removed from elbow  Pt ed on scar massage  -still adhere where delayed healing occur PROM for wrist extention  And prayer stretch reviewed - more ease this date per pt  10 reps Both  AAROM for tendon glides Increase edema and stiffness in MC's  Pt to wear wrist splint in combination with isotoner glove  Pronation wheel done - and with PROM by OT  20 reps  Prolonged stretch for pronation 3 x 30 sec  Contrast relax for elbow flexion - in supine -  rhitmic flexion and extention stretch alternate - AROM to R ear and hip 1 lbs weight for R ear to hip  10 reps  Flexion of elbow 120            OT Education - 06/15/16 1421    Education provided Yes   Education Details HEP changes and use of hand    Person(s) Educated Patient   Methods Explanation;Demonstration;Tactile cues;Verbal cues;Handout   Comprehension Verbal cues required;Returned demonstration;Verbalized understanding          OT Short Term Goals -  05/31/16 1751      OT SHORT TERM GOAL #1   Title R hand digits flexion able to touch palm to hold toothbrush and  utencils   Baseline Digits flexion at [facility] 's and PIP impaired , and MC  extention    Time 3   Period Weeks   Status New     OT SHORT TERM GOAL #2   Title Pt independing in HEP to decrease scar tissue, increase  digits and wrist AROM to turn doorknob, do puzzles, wash dishes   Baseline see flowsheet    Time 3   Period Weeks   Status New     OT SHORT TERM GOAL #3   Title R wrist sup/pro and wrist extention improve with more than30 degrees to turn doorknob and pages, use sweeper duster   Baseline 45 degrees sup/pro; wrist ext 0 degrees   Time 3   Period Weeks   Status New           OT Long Term Goals - 05/31/16 1757      OT LONG TERM GOAL #1   Title R elbow extention improve with more than 30 degrees  to  tie shoes and reach in pocket   Baseline extention  -55;    Time 5   Period Weeks   Status New     OT LONG TERM GOAL #2   Title R elbow flexion improve with with more than 20 degrees to do hair, apply deodorant, do jewelry    Baseline R elbow flexion 110   Time 5   Period Weeks   Status New     OT LONG TERM GOAL #3   Title R grip strength improve for pt to have more than 50% grip compare to L hand    Baseline NT - 6 wks s/p   Time 6   Period Weeks   Status New     OT LONG TERM GOAL #4   Title Function on PREE improve more than 20 points    Baseline PREE function score 49/50   Time 5   Period Weeks   Status New               Plan - 06/15/16 1422    Clinical Impression Statement Reinforce again this date for pt to use hand - work it while watching tv - and maintain flexion and extention after stretches - wear glove and wrist splint to prevent wrist from hanging -   husband was ed on helping pt with stretch but he is aftaid will hurt her    Rehab Potential Good   OT Frequency 3x / week   OT Duration 6 weeks   OT Treatment/Interventions Self-care/ADL training;Moist Heat;Fluidtherapy;Splinting;Patient/family education;Therapeutic exercises;Contrast Bath;Ultrasound;Scar mobilization;Passive range of motion;Parrafin;Manual Therapy   Plan measure flexion and extention of elbow    OT Home Exercise Plan see pt instruction    Consulted and Agree with Plan of Care Patient      Patient will benefit from skilled therapeutic intervention in order to improve the following deficits and impairments:  Decreased range of motion, Impaired flexibility, Increased edema, Decreased knowledge of precautions, Impaired UE functional use, Pain, Decreased strength  Visit Diagnosis: Stiffness of right elbow, not elsewhere classified  Stiffness of right hand, not elsewhere classified  Stiffness of right wrist, not elsewhere classified  Numbness and tingling of hand  Muscle weakness (generalized)    Problem List Patient Active Problem  List   Diagnosis Date Noted  .  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 Atypical ductal hyperplasia, breast 09/26/2012    Class: Diagnosis of    Rosalyn Gess OTR/L,CLT 06/15/2016, 2:25 PM  [place] [facility] [address] [place], [address] Phone: [phone]   Fax:  [phone]  Name: Megan Hodge MRN: [id number] Date of Birth: Aug 29, 1945

## 2016-06-15 NOTE — Patient Instructions (Signed)
[place] for pt scar massage  Flexion to R ear - and keep it for about 2 hrs afterwards  Add about 1 lbs or lighter Flexion and extention stretch against wall -  Wrist prayer stretch  And to wear glove in combination wrist extention splint - 2 x day hour or 2

## 2016-06-18 ENCOUNTER — Ambulatory Visit: Payer: PPO | Admitting: Occupational Therapy

## 2016-06-18 DIAGNOSIS — M25631 Stiffness of right wrist, not elsewhere classified: Secondary | ICD-10-CM

## 2016-06-18 DIAGNOSIS — M25621 Stiffness of right elbow, not elsewhere classified: Secondary | ICD-10-CM

## 2016-06-18 DIAGNOSIS — R202 Paresthesia of skin: Secondary | ICD-10-CM

## 2016-06-18 DIAGNOSIS — M6281 Muscle weakness (generalized): Secondary | ICD-10-CM

## 2016-06-18 DIAGNOSIS — R200 Anesthesia of skin: Secondary | ICD-10-CM

## 2016-06-18 DIAGNOSIS — M25641 Stiffness of right hand, not elsewhere classified: Secondary | ICD-10-CM

## 2016-06-18 NOTE — Therapy (Signed)
[place] [facility] [address], [place], [address] Phone: [phone]   Fax:  [phone]  Occupational Therapy Treatment  Patient Details  Name: Megan Hodge MRN: [id number] Date of Birth: 12/07/1945 Referring Provider: Celesta Aver  Encounter Date: 06/18/2016      OT End of Session - 06/18/16 1048    Visit Number 6   Number of Visits 18   Date for OT Re-Evaluation 07/12/16   OT Start Time 1025   OT Stop Time 1127   OT Time Calculation (min) 62 min   Activity Tolerance Patient tolerated treatment well   Behavior During Therapy [facility] for tasks assessed/performed      Past Medical History:  Diagnosis Date  . Abnormal mammogram, unspecified    right breast  . Cancer ([place]) 1968   cervical cancer  . Hypertension 1995    Past Surgical History:  Procedure Laterality Date  . ABDOMINAL HYSTERECTOMY  1993  . APPENDECTOMY  1993  . BREAST BIOPSY Right 1992  . BREAST BIOPSY Right March 2014   Atypical ductal hyperplasia. Failed trial of tamoxifen for chemoprevention.  . COLONOSCOPY  08/2014   Dr. Vira Agar  . DILATION AND CURETTAGE OF UTERUS  1968    There were no vitals filed for this visit.      Subjective Assessment - 06/18/16 1023    Subjective  Was not so sore after last time - my hand feels good in the am when waking up - and then as soon as I am up - it starts swelling and fingers/Wrist gets stiff - look you can see it - also my pinkie and ring finger gets more numb    Patient Stated Goals Want to use my R dominant hand like prior to ROM - brushing teeth, eating , cutting food, phone , fixing hair,  bathing , dressing    Currently in Pain? No/denies                      OT Treatments/Exercises (OP) - 06/18/16 0001      RUE Paraffin   Number Minutes Paraffin 10 Minutes   Comments At [facility] to hand , wrist and forearm - and heatingpad on elbow for extention stretch - adjusted 2 x       Measure ext -45 , flexion after 10 reps of PROM and AROM to ear - R elbow flexion - AROM 120 and PROM 125   Paraffin done  And heatingpad   elbow extention - adjusted 2 x to increase extention stretch using large heating pad at [facility] -  pillow under hand   Graston tools nr 2 and 4 use for volar forearm , wrist , distal upper  Upper arm in combination with wrist and elbow extention stretch  done sweeping and scooping - tight extremely in volar wrist , and over distal bicep   PROM for wrist extention -and prolonged extention stretch in combination digits extention  PROM and prolonged stretch done for supination /pronation done - followed by AROM sup/pro 10 reps each   contract and relax elbow extention  myofascial release stretch and rhitmic stretch into elbow extention - in supine  10 reps each  Husband taught to help pt after heat at home - but afraid he will hurt her- pt to ask this weekend for daughter Pt self stretch against wall for extention -and taught to do at home again PROM for elbow extention -35 , AROM -40  OT Education - 06/18/16 1048    Education provided Yes   Education Details HEP changes   Person(s) Educated Patient   Methods Explanation;Demonstration;Tactile cues;Verbal cues   Comprehension Verbal cues required;Returned demonstration;Verbalized understanding          OT Short Term Goals - 05/31/16 1751      OT SHORT TERM GOAL #1   Title R hand digits flexion able to touch palm to hold toothbrush and  utencils   Baseline Digits flexion at [facility] 's and PIP impaired , and MC  extention    Time 3   Period Weeks   Status New     OT SHORT TERM GOAL #2   Title Pt independing in HEP to decrease scar tissue, increase  digits and wrist AROM to turn doorknob, do puzzles, wash dishes   Baseline see flowsheet    Time 3   Period Weeks   Status New     OT SHORT TERM GOAL #3   Title R wrist sup/pro and wrist extention improve with more than30  degrees to turn doorknob and pages, use sweeper duster   Baseline 45 degrees sup/pro; wrist ext 0 degrees   Time 3   Period Weeks   Status New           OT Long Term Goals - 05/31/16 1757      OT LONG TERM GOAL #1   Title R elbow extention improve with more than 30 degrees  to  tie shoes and reach in pocket   Baseline extention -55;    Time 5   Period Weeks   Status New     OT LONG TERM GOAL #2   Title R elbow flexion improve with with more than 20 degrees to do hair, apply deodorant, do jewelry    Baseline R elbow flexion 110   Time 5   Period Weeks   Status New     OT LONG TERM GOAL #3   Title R grip strength improve for pt to have more than 50% grip compare to L hand    Baseline NT - 6 wks s/p   Time 6   Period Weeks   Status New     OT LONG TERM GOAL #4   Title Function on PREE improve more than 20 points    Baseline PREE function score 49/50   Time 5   Period Weeks   Status New               Plan - 06/18/16 1049    Clinical Impression Statement Pt arrive with extention -45 - during session can at end get to PROM -35 - flexion after PROM and AROM coming in prior to heat - touch R ear - pt got emotional in session this date because of  not able to use hand and arm - slow progress ,  discuss with pt that not every pt is same - and she had a lot hardware - and has stiffness in forearm and hand - that makes her progress slower - also will request when appt with MD 20th - JAS splint order    Rehab Potential Good   OT Frequency 3x / week   OT Duration 4 weeks   OT Treatment/Interventions Self-care/ADL training;Moist Heat;Fluidtherapy;Splinting;Patient/family education;Therapeutic exercises;Contrast Bath;Ultrasound;Scar mobilization;Passive range of motion;Parrafin;Manual Therapy   Plan Measure flexion coming in - how pt is doing ?    OT Home Exercise Plan see pt instruction    Consulted and Agree  with Plan of Care Patient      Patient will benefit from  skilled therapeutic intervention in order to improve the following deficits and impairments:  Decreased range of motion, Impaired flexibility, Increased edema, Decreased knowledge of precautions, Impaired UE functional use, Pain, Decreased strength  Visit Diagnosis: Stiffness of right elbow, not elsewhere classified  Stiffness of right hand, not elsewhere classified  Stiffness of right wrist, not elsewhere classified  Numbness and tingling of hand  Muscle weakness (generalized)    Problem List Patient Active Problem List   Diagnosis Date Noted  . 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 Atypical ductal hyperplasia, breast 09/26/2012    Class: Diagnosis of    Rosalyn Gess OTR/L,CLT 06/18/2016, 3:40 PM  [place] [facility] [address] [place], [address] Phone: [phone]   Fax:  [phone]  Name: Megan Hodge MRN: [id number] Date of Birth: [birth year]

## 2016-06-18 NOTE — Patient Instructions (Addendum)
Same HEP as last time - reinforce to do PROM and stretches  Prior to AROM

## 2016-06-21 ENCOUNTER — Ambulatory Visit: Payer: PPO | Admitting: Occupational Therapy

## 2016-06-21 DIAGNOSIS — R202 Paresthesia of skin: Secondary | ICD-10-CM

## 2016-06-21 DIAGNOSIS — M6281 Muscle weakness (generalized): Secondary | ICD-10-CM

## 2016-06-21 DIAGNOSIS — M25641 Stiffness of right hand, not elsewhere classified: Secondary | ICD-10-CM

## 2016-06-21 DIAGNOSIS — M25621 Stiffness of right elbow, not elsewhere classified: Secondary | ICD-10-CM | POA: Diagnosis not present

## 2016-06-21 DIAGNOSIS — M25631 Stiffness of right wrist, not elsewhere classified: Secondary | ICD-10-CM

## 2016-06-21 DIAGNOSIS — R200 Anesthesia of skin: Secondary | ICD-10-CM

## 2016-06-21 NOTE — Patient Instructions (Signed)
  Prolonged elbow extention stretch - Push into 6 inch then 5 inch ball - 8-10 x 10 sec to 30 sec  ( Pt TO DO AT HOME) after PROM and stretches    Supination and pronation done with wheel but add PROM with other hand to forearm - needed mod A  20 reps each  Place and hold each 10 reps with wheel and PROM  Add to HEP

## 2016-06-21 NOTE — Therapy (Signed)
[place] [facility] [address], [place], [address] Phone: [phone]   Fax:  [phone]  Occupational Therapy Treatment  Patient Details  Name: Megan Hodge MRN: [id number] Date of Birth: August 17, 1945 Referring Provider: Celesta Aver  Encounter Date: 06/21/2016      OT End of Session - 06/21/16 1449    Visit Number 7   Number of Visits 18   Date for OT Re-Evaluation 07/12/16   OT Start Time 1220   OT Stop Time 1301   OT Time Calculation (min) 41 min   Activity Tolerance Patient tolerated treatment well   Behavior During Therapy [facility] for tasks assessed/performed      Past Medical History:  Diagnosis Date  . Abnormal mammogram, unspecified    right breast  . Cancer ([place]) 1968   cervical cancer  . Hypertension 1995    Past Surgical History:  Procedure Laterality Date  . ABDOMINAL HYSTERECTOMY  1993  . APPENDECTOMY  1993  . BREAST BIOPSY Right 1992  . BREAST BIOPSY Right March 2014   Atypical ductal hyperplasia. Failed trial of tamoxifen for chemoprevention.  . COLONOSCOPY  08/2014   Dr. Vira Agar  . DILATION AND CURETTAGE OF UTERUS  1968    There were no vitals filed for this visit.      Subjective Assessment - 06/21/16 1447    Subjective  Doing okay - did do some bending of elbow this am and was able to touch my ear -but now stiff again - fingers just get swollen as soon as it hangs - and numbness certain position-  cannot make fist    Patient Stated Goals Want to use my R dominant hand like prior to ROM - brushing teeth, eating , cutting food, phone , fixing hair,  bathing , dressing    Currently in Pain? No/denies                      OT Treatments/Exercises (OP) - 06/21/16 0001      RUE Paraffin   Number Minutes Paraffin 10 Minutes   Comments at [facility] to R elbow to increase extnetion       RUE Fluidotherapy   Number Minutes Fluidotherapy 10 Minutes   RUE Fluidotherapy Location  Hand;Wrist   Comments AT [facility] while keeping paraffin in - and do AROM for digits and wrist at [facility] to decrease pain and stiffness       Measure ext -45 , 40 at end of session , PROM 35   Paraffin done to elbow - and fluido for hand and wrist  AROM for composite fist to 4 cm and 2 cm foam block  10 reps  Most tightness in [facility] - plan flexion glove to order ( pt 3.5cm across MC's)  Prolonged elbow extention stretch -with myofascial stretch in combination 8 x  Foam block under elbow for cushioning  Graston tools nr 2 and 4 use for volar forearm , wrist , distal upper Upper arm in combination with wrist and elbow extention stretch  done sweeping and scooping - tight extremely in volar wrist , and over distal bicep   PROM for wrist extention -and prolonged extention stretch in combination digits extention  contract and relax elbow extention into wall  Push into 6 inch then 5 inch ball - 8-10 x 10 sec to 30 sec  ( Pt TO DO AT HOME) after PROM and stretches myofascial release stretch and rhitmic stretch into elbow  extention    Supination and pronation done with wheel but add PROM with other hand to forearm - needed mod A  20 reps each  Place and hold each 10 reps with wheel and PROM  Add to HEP              OT Education - 06/21/16 1449    Education provided Yes   Education Details HEP changes    Person(s) Educated Patient   Methods Explanation;Demonstration;Verbal cues;Tactile cues   Comprehension Verbalized understanding;Returned demonstration;Verbal cues required          OT Short Term Goals - 05/31/16 1751      OT SHORT TERM GOAL #1   Title R hand digits flexion able to touch palm to hold toothbrush and  utencils   Baseline Digits flexion at [facility] 's and PIP impaired , and MC  extention    Time 3   Period Weeks   Status New     OT SHORT TERM GOAL #2   Title Pt independing in HEP to decrease scar tissue, increase  digits and wrist AROM to turn doorknob, do  puzzles, wash dishes   Baseline see flowsheet    Time 3   Period Weeks   Status New     OT SHORT TERM GOAL #3   Title R wrist sup/pro and wrist extention improve with more than30 degrees to turn doorknob and pages, use sweeper duster   Baseline 45 degrees sup/pro; wrist ext 0 degrees   Time 3   Period Weeks   Status New           OT Long Term Goals - 05/31/16 1757      OT LONG TERM GOAL #1   Title R elbow extention improve with more than 30 degrees  to  tie shoes and reach in pocket   Baseline extention -55;    Time 5   Period Weeks   Status New     OT LONG TERM GOAL #2   Title R elbow flexion improve with with more than 20 degrees to do hair, apply deodorant, do jewelry    Baseline R elbow flexion 110   Time 5   Period Weeks   Status New     OT LONG TERM GOAL #3   Title R grip strength improve for pt to have more than 50% grip compare to L hand    Baseline NT - 6 wks s/p   Time 6   Period Weeks   Status New     OT LONG TERM GOAL #4   Title Function on PREE improve more than 20 points    Baseline PREE function score 49/50   Time 5   Period Weeks   Status New               Plan - 06/21/16 1450    Clinical Impression Statement Pt show increase extention this date - to ROM 40 in clinic and PROM 35 - but pt xtremely tight for sup/pro , as well as flexion and extention - and then stiffness in hand and wrist extention - pt to see pt next week - plan to reoommend JAS splint    Rehab Potential Good   OT Frequency 3x / week   OT Duration 4 weeks   OT Treatment/Interventions Self-care/ADL training;Moist Heat;Fluidtherapy;Splinting;Patient/family education;Therapeutic exercises;Contrast Bath;Ultrasound;Scar mobilization;Passive range of motion;Parrafin;Manual Therapy   Plan Measure extention and flexion at elbow    OT Home Exercise Plan see pt instruction  Consulted and Agree with Plan of Care Patient      Patient will benefit from skilled therapeutic  intervention in order to improve the following deficits and impairments:  Decreased range of motion, Impaired flexibility, Increased edema, Decreased knowledge of precautions, Impaired UE functional use, Pain, Decreased strength  Visit Diagnosis: Stiffness of right elbow, not elsewhere classified  Stiffness of right hand, not elsewhere classified  Stiffness of right wrist, not elsewhere classified  Numbness and tingling of hand  Muscle weakness (generalized)    Problem List Patient Active Problem List   Diagnosis Date Noted  . 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 Atypical ductal hyperplasia, breast 09/26/2012    Class: Diagnosis of    Rosalyn Gess OTR/L,CLT 06/21/2016, 2:58 PM  [place] [facility] [address] [place], [address] Phone: [phone]   Fax:  [phone]  Name: Megan Hodge MRN: [id number] Date of Birth: August 10, 1945

## 2016-06-22 ENCOUNTER — Ambulatory Visit: Payer: PPO | Admitting: Occupational Therapy

## 2016-06-22 DIAGNOSIS — R200 Anesthesia of skin: Secondary | ICD-10-CM

## 2016-06-22 DIAGNOSIS — M25631 Stiffness of right wrist, not elsewhere classified: Secondary | ICD-10-CM

## 2016-06-22 DIAGNOSIS — R202 Paresthesia of skin: Secondary | ICD-10-CM

## 2016-06-22 DIAGNOSIS — M25621 Stiffness of right elbow, not elsewhere classified: Secondary | ICD-10-CM

## 2016-06-22 DIAGNOSIS — M25641 Stiffness of right hand, not elsewhere classified: Secondary | ICD-10-CM

## 2016-06-22 DIAGNOSIS — M6281 Muscle weakness (generalized): Secondary | ICD-10-CM

## 2016-06-22 NOTE — Patient Instructions (Addendum)
Same HEP for PROM , stretch , AROM - 1 lbs for flexion  To do 2 sessions of each flexion and extention Sup/pro PROM and wheel  Wrist extnetion prayer stretch   Pt to do AROM with foam block - yellow light one every hour to 2 hrs at home - to increase ROM  And to touch earlobe on R during flexion HEP

## 2016-06-22 NOTE — Therapy (Signed)
[place] [facility] [address] [place], [address] Phone: [phone]   Fax:  [phone]  Occupational Therapy Treatment  Patient Details  Name: Megan Hodge MRN: [id number] Date of Birth: [birth year] Referring Provider: Celesta Aver  Encounter Date: 06/22/2016      OT End of Session - 06/22/16 1709    Visit Number 8   Number of Visits 18   Date for OT Re-Evaluation 07/12/16   OT Start Time 1132   OT Stop Time 1216   OT Time Calculation (min) 44 min   Activity Tolerance Patient tolerated treatment well   Behavior During Therapy [facility] for tasks assessed/performed      Past Medical History:  Diagnosis Date  . Abnormal mammogram, unspecified    right breast  . Cancer ([place]) 1968   cervical cancer  . Hypertension 1995    Past Surgical History:  Procedure Laterality Date  . ABDOMINAL HYSTERECTOMY  1993  . APPENDECTOMY  1993  . BREAST BIOPSY Right 1992  . BREAST BIOPSY Right March 2014   Atypical ductal hyperplasia. Failed trial of tamoxifen for chemoprevention.  . COLONOSCOPY  08/2014   Dr. Vira Agar  . DILATION AND CURETTAGE OF UTERUS  1968    There were no vitals filed for this visit.      Subjective Assessment - 06/22/16 1703    Subjective  tried to get something about 5-6inches out from the wall to push into - not really any luck - did try and touch my ear - and slept last night only with my wrist splint - not the elbow splint - thought my hand was not as swollen    Patient Stated Goals Want to use my R dominant hand like prior to ROM - brushing teeth, eating , cutting food, phone , fixing hair,  bathing , dressing    Currently in Pain? No/denies                      OT Treatments/Exercises (OP) - 06/22/16 0001      RUE Paraffin   Number Minutes Paraffin 10 Minutes   Comments At [facility] to hand and  elbow to increase digits ROM and wrist ext, and elbow flexion     Measure ext -50 at  [facility] , flexion at end of session 115 degrees  Paraffin done to elbow and hand  Prolong flexion stretch for elbow using large heatinpad - adjusted 2 x during time   AROM for composite fist to 4 cm and 2 cm foam block  10 reps  Most tightness in [facility] - ordered flexion glove to use at home Elbow flexion contract and relax done with  stretch at end each time -with scar massage and trigger point release over tricep during flexion stretch   1 lbs done into flexion  Of elbow - end range AAROM done 10 reps  And then 10 reps AROM- able to touch earlobe    Pt to do AROM with foamblock - yellow light one every hour to 2 hrs at home - to increase ROM  And to touch earlobe on R during flexion HEP             OT Education - 06/22/16 1709    Education provided Yes   Education Details HEP reinforce    Person(s) Educated Patient   Methods Explanation;Demonstration;Tactile cues;Verbal cues   Comprehension Verbal cues required;Returned demonstration;Verbalized understanding  OT Short Term Goals - 05/31/16 1751      OT SHORT TERM GOAL #1   Title R hand digits flexion able to touch palm to hold toothbrush and  utencils   Baseline Digits flexion at [facility] 's and PIP impaired , and MC  extention    Time 3   Period Weeks   Status New     OT SHORT TERM GOAL #2   Title Pt independing in HEP to decrease scar tissue, increase  digits and wrist AROM to turn doorknob, do puzzles, wash dishes   Baseline see flowsheet    Time 3   Period Weeks   Status New     OT SHORT TERM GOAL #3   Title R wrist sup/pro and wrist extention improve with more than30 degrees to turn doorknob and pages, use sweeper duster   Baseline 45 degrees sup/pro; wrist ext 0 degrees   Time 3   Period Weeks   Status New           OT Long Term Goals - 05/31/16 1757      OT LONG TERM GOAL #1   Title R elbow extention improve with more than 30 degrees  to  tie shoes and reach in pocket   Baseline extention -55;     Time 5   Period Weeks   Status New     OT LONG TERM GOAL #2   Title R elbow flexion improve with with more than 20 degrees to do hair, apply deodorant, do jewelry    Baseline R elbow flexion 110   Time 5   Period Weeks   Status New     OT LONG TERM GOAL #3   Title R grip strength improve for pt to have more than 50% grip compare to L hand    Baseline NT - 6 wks s/p   Time 6   Period Weeks   Status New     OT LONG TERM GOAL #4   Title Function on PREE improve more than 20 points    Baseline PREE function score 49/50   Time 5   Period Weeks   Status New               Plan - 06/22/16 1711    Clinical Impression Statement Pt show improvement in elbow flexion this date -and less tightness over MC's - still cont to be xtremely tight in sup/pro, elbow ext, flexion , wrist extention - flexion glove ordered for fisting - pt to  do HEP often    Rehab Potential Good   OT Frequency 3x / week   OT Duration 4 weeks   OT Treatment/Interventions Self-care/ADL training;Moist Heat;Fluidtherapy;Splinting;Patient/family education;Therapeutic exercises;Contrast Bath;Ultrasound;Scar mobilization;Passive range of motion;Parrafin;Manual Therapy   OT Home Exercise Plan see pt instruction    Consulted and Agree with Plan of Care Patient      Patient will benefit from skilled therapeutic intervention in order to improve the following deficits and impairments:  Decreased range of motion, Impaired flexibility, Increased edema, Decreased knowledge of precautions, Impaired UE functional use, Pain, Decreased strength  Visit Diagnosis: Stiffness of right elbow, not elsewhere classified  Stiffness of right hand, not elsewhere classified  Stiffness of right wrist, not elsewhere classified  Numbness and tingling of hand  Muscle weakness (generalized)    Problem List Patient Active Problem List   Diagnosis Date Noted  . 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 Atypical ductal  hyperplasia, breast 09/26/2012  Class: Diagnosis of    Rosalyn Gess OTR/L,CLT 06/22/2016, 5:12 PM  [place] [facility] [address] [place], [address] Phone: [phone]   Fax:  [phone]  Name: Megan Hodge MRN: [id number] Date of Birth: 1945/11/16

## 2016-06-24 ENCOUNTER — Ambulatory Visit: Payer: PPO | Admitting: Occupational Therapy

## 2016-06-24 DIAGNOSIS — M25641 Stiffness of right hand, not elsewhere classified: Secondary | ICD-10-CM

## 2016-06-24 DIAGNOSIS — M6281 Muscle weakness (generalized): Secondary | ICD-10-CM

## 2016-06-24 DIAGNOSIS — R202 Paresthesia of skin: Secondary | ICD-10-CM

## 2016-06-24 DIAGNOSIS — M25621 Stiffness of right elbow, not elsewhere classified: Secondary | ICD-10-CM | POA: Diagnosis not present

## 2016-06-24 DIAGNOSIS — R200 Anesthesia of skin: Secondary | ICD-10-CM

## 2016-06-24 DIAGNOSIS — M25631 Stiffness of right wrist, not elsewhere classified: Secondary | ICD-10-CM

## 2016-06-24 NOTE — Therapy (Signed)
[name] [address], [place], [address] Phone: [phone]   Fax:  [phone]  Occupational Therapy Treatment  Patient Details  Name: Megan Hodge MRN: [id number] Date of Birth: Jan 03, 1946 Referring Provider: Celesta Aver  Encounter Date: 06/24/2016      OT End of Session - 06/24/16 1548    Visit Number 9   Number of Visits 18   Date for OT Re-Evaluation 07/12/16   OT Start Time 1520   OT Stop Time 1622   OT Time Calculation (min) 62 min   Activity Tolerance Patient tolerated treatment well   Behavior During Therapy [name] for tasks assessed/performed      Past Medical History:  Diagnosis Date  . Abnormal mammogram, unspecified    right breast  . Cancer ([place]) 1968   cervical cancer  . Hypertension 1995    Past Surgical History:  Procedure Laterality Date  . ABDOMINAL HYSTERECTOMY  1993  . APPENDECTOMY  1993  . BREAST BIOPSY Right 1992  . BREAST BIOPSY Right March 2014   Atypical ductal hyperplasia. Failed trial of tamoxifen for chemoprevention.  . COLONOSCOPY  08/2014   Dr. Vira Agar  . DILATION AND CURETTAGE OF UTERUS  1968    There were no vitals filed for this visit.      Subjective Assessment - 06/24/16 1544    Subjective  I dress myself and pull up my socks - fold some laundry - but otherwise doing about the same - hand still stiff    Patient Stated Goals Want to use my R dominant hand like prior to ROM - brushing teeth, eating , cutting food, phone , fixing hair,  bathing , dressing    Currently in Pain? No/denies            [name] OT Assessment - 06/24/16 0001      AROM   Right Elbow Flexion 115   Right Elbow Extension -50  AROM -40 and PROM -35 this date in session   Right Forearm Pronation 45 Degrees   Right Forearm Supination 45 Degrees   Right Wrist Extension 12 Degrees   Right Wrist Flexion 64 Degrees   Right Wrist Radial Deviation 14 Degrees   Right Wrist Ulnar  Deviation 30 Degrees     Right Hand AROM   R Index  MCP 0-90 70 Degrees   R Index PIP 0-100 90 Degrees   R Long  MCP 0-90 85 Degrees   R Long PIP 0-100 85 Degrees   R Ring  MCP 0-90 85 Degrees   R Ring PIP 0-100 90 Degrees   R Little  MCP 0-90 85 Degrees   R Little PIP 0-100 85 Degrees                  OT Treatments/Exercises (OP) - 06/24/16 0001      RUE Paraffin   Number Minutes Paraffin 10 Minutes   Comments At [name] to hand and elbow with large heatingpad  for increase elbow extention stretch - with pillow under hand - neutral position       Measure digits AROM , wrist , and elbow ( see flowsheet)  Measure elbow  ext -50  , -40 at end of session , PROM -35   Paraffin done to elbow and hand  - large heatingpad over elbow for extention stretch AROM for composite fist to 4 cm and  AAROM digits flexion to 2 cm foam block  10 reps  Most tightness in [name] - plan flexion glove to order ( pt 3.5cm across MC's)  Prolonged elbow extention stretch -with myofascial stretch in combination  8 reps  And rhitmic stretch for elbow extention 10 reps    Scar mobs and massage done    PROM for wrist extention -and prolonged extention stretch in combination digits extention  contract and relax elbow extention - PROM at end range 10 reps in supine 1 lbs weight (cuff ) for elbow extention into wall -with roll up towel back of upper arm (TO DO AT HOME) YTB for elbow and shoulder extention 10 reps     CPM on BTE for wrist extention 200 sec   ( pt to stop closer to face to get into pronation)  Followed by AROM for wrist extention                   OT Education - 06/24/16 1547    Education provided Yes   Education Details HEP reinforce   Person(s) Educated Patient   Methods Explanation;Demonstration;Tactile cues;Verbal cues   Comprehension Verbal cues required;Returned demonstration;Verbalized understanding          OT Short Term Goals - 05/31/16  1751      OT SHORT TERM GOAL #1   Title R hand digits flexion able to touch palm to hold toothbrush and  utencils   Baseline Digits flexion at [facility] 's and PIP impaired , and MC  extention    Time 3   Period Weeks   Status New     OT SHORT TERM GOAL #2   Title Pt independing in HEP to decrease scar tissue, increase  digits and wrist AROM to turn doorknob, do puzzles, wash dishes   Baseline see flowsheet    Time 3   Period Weeks   Status New     OT SHORT TERM GOAL #3   Title R wrist sup/pro and wrist extention improve with more than30 degrees to turn doorknob and pages, use sweeper duster   Baseline 45 degrees sup/pro; wrist ext 0 degrees   Time 3   Period Weeks   Status New           OT Long Term Goals - 05/31/16 1757      OT LONG TERM GOAL #1   Title R elbow extention improve with more than 30 degrees  to  tie shoes and reach in pocket   Baseline extention -55;    Time 5   Period Weeks   Status New     OT LONG TERM GOAL #2   Title R elbow flexion improve with with more than 20 degrees to do hair, apply deodorant, do jewelry    Baseline R elbow flexion 110   Time 5   Period Weeks   Status New     OT LONG TERM GOAL #3   Title R grip strength improve for pt to have more than 50% grip compare to L hand    Baseline NT - 6 wks s/p   Time 6   Period Weeks   Status New     OT LONG TERM GOAL #4   Title Function on PREE improve more than 20 points    Baseline PREE function score 49/50   Time 5   Period Weeks   Status New               Plan - 06/24/16 1548    Clinical Impression Statement Pt did show some  progress in the last 3 wks in digits flexion , wrist extention and elbow extention more than flexion - but still stiff extremely in  elbow ext, flexion and sup/pro - will request order for JAS splint  next week from MD    Rehab Potential Good   OT Frequency 3x / week   OT Duration 4 weeks   OT Treatment/Interventions Self-care/ADL training;Moist  Heat;Fluidtherapy;Splinting;Patient/family education;Therapeutic exercises;Contrast Bath;Ultrasound;Scar mobilization;Passive range of motion;Parrafin;Manual Therapy   Plan Assess progress - work on [place] see pt instruction    Consulted and Agree with Plan of Care Patient      Patient will benefit from skilled therapeutic intervention in order to improve the following deficits and impairments:  Decreased range of motion, Impaired flexibility, Increased edema, Decreased knowledge of precautions, Impaired UE functional use, Pain, Decreased strength  Visit Diagnosis: Stiffness of right elbow, not elsewhere classified  Stiffness of right hand, not elsewhere classified  Stiffness of right wrist, not elsewhere classified  Numbness and tingling of hand  Muscle weakness (generalized)    Problem List Patient Active Problem List   Diagnosis Date Noted  . 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 Atypical ductal hyperplasia, breast 09/26/2012    Class: Diagnosis of    Rosalyn Gess OTR/L,CLT 06/24/2016, 4:34 PM  [place] [facility] [address], [place], [address] Phone: [phone]   Fax:  [phone]  Name: Megan Hodge MRN: [id number] Date of Birth: 1946-07-11

## 2016-06-24 NOTE — Patient Instructions (Addendum)
Pt to focus on PROM elbow flexion, extention  AROM and 1 lbs weight for standing elbow ext Lying down elbow flexion to ear  PROM and wheel for sup/pro Wrist extention - prayer stretch  Digits AROM to yellow block 4 cm and 2 cm

## 2016-06-28 ENCOUNTER — Ambulatory Visit: Payer: PPO | Admitting: Occupational Therapy

## 2016-06-28 DIAGNOSIS — M25641 Stiffness of right hand, not elsewhere classified: Secondary | ICD-10-CM

## 2016-06-28 DIAGNOSIS — M25621 Stiffness of right elbow, not elsewhere classified: Secondary | ICD-10-CM

## 2016-06-28 DIAGNOSIS — R202 Paresthesia of skin: Secondary | ICD-10-CM

## 2016-06-28 DIAGNOSIS — R200 Anesthesia of skin: Secondary | ICD-10-CM

## 2016-06-28 DIAGNOSIS — M6281 Muscle weakness (generalized): Secondary | ICD-10-CM

## 2016-06-28 DIAGNOSIS — M25631 Stiffness of right wrist, not elsewhere classified: Secondary | ICD-10-CM

## 2016-06-28 NOTE — Patient Instructions (Signed)
  Cont with AAROM and AROM for digits flexion  Wrist extention  Sup/pro - PROM and using wheel  Prolonged elbow extention stretch  YTB for elbow and shoulder extention 10 reps  - each isolated Ed husband on PROM or stretch to elbow extention against wall    As well as ed pt and husband on YTB wrap around elbow flexion to stretch when watching tv - or using towel and pull behind back - stretch to posterior neck

## 2016-06-28 NOTE — Therapy (Signed)
[place] [facility] [address], [place], [address] Phone: [phone]   Fax:  [phone]  Occupational Therapy Treatment  Patient Details  Name: Megan Hodge MRN: [id number] Date of Birth: [birth year] Referring Provider: Celesta Aver  Encounter Date: 06/28/2016      OT End of Session - 06/28/16 1229    Visit Number 10   Number of Visits 18   Date for OT Re-Evaluation 07/12/16   OT Start Time 1115   OT Stop Time 1201   OT Time Calculation (min) 46 min   Activity Tolerance Patient tolerated treatment well   Behavior During Therapy [facility] for tasks assessed/performed      Past Medical History:  Diagnosis Date  . Abnormal mammogram, unspecified    right breast  . Cancer ([place]) 1968   cervical cancer  . Hypertension 1995    Past Surgical History:  Procedure Laterality Date  . ABDOMINAL HYSTERECTOMY  1993  . APPENDECTOMY  1993  . BREAST BIOPSY Right 1992  . BREAST BIOPSY Right March 2014   Atypical ductal hyperplasia. Failed trial of tamoxifen for chemoprevention.  . COLONOSCOPY  08/2014   Dr. Vira Agar  . DILATION AND CURETTAGE OF UTERUS  1968    There were no vitals filed for this visit.      Subjective Assessment - 06/28/16 1216    Subjective  Was able to eat banana with my hand - and trying to wash dishes - it I put my arm down sitting - I can feel the swelling come and numbness - and hand swells up    Patient Stated Goals Want to use my R dominant hand like prior to ROM - brushing teeth, eating , cutting food, phone , fixing hair,  bathing , dressing    Currently in Pain? No/denies            [facility] OT Assessment - 06/28/16 0001      AROM   Right Elbow Flexion 120   Right Elbow Extension -45       Measure elbow  ext -45  , coming in flexion 120   Paraffin done to elbow and hand  - large heatingpad over elbow for extention stretch- need pillow under hand and rolled up towel under upper  arm - to decrease compression on elbow AAROM for composite fist to 4 cm foam block  2 cm foam block  10 reps  Most tightness in MC's and composite  - plan flexion glove   Prolonged elbow extention stretch -with myofascial stretch in combination  10 reps  And rhitmic stretch for elbow extention 10 reps to hip  ROM for wrist extention -and prolonged extention stretch in combination digits extention  contract and relax elbow extention - PROM at end range 10 reps in supine  YTB for elbow and shoulder extention 10 reps  - each isolated Ed husband on PROM or stretch to elbow extention against wall    As well as ed pt and husband on YTB wrap around elbow flexion to stretch when watching tv - or using towel and pull behind back - stretch to posterior neck                        OT Education - 06/28/16 1228    Education provided Yes   Education Details HEP reviewed   Person(s) Educated Patient;Spouse   Methods Explanation;Demonstration;Tactile cues;Verbal cues;Handout   Comprehension Verbal cues required;Returned demonstration;Verbalized  understanding          OT Short Term Goals - 06/28/16 1231      OT SHORT TERM GOAL #1   Title R hand digits flexion able to touch palm to hold toothbrush and  utencils   Baseline PRogressing - see flowsheet   Time 3   Period Weeks   Status On-going     OT SHORT TERM GOAL #2   Title Pt independing in HEP to decrease scar tissue, increase  digits and wrist AROM to turn doorknob, do puzzles, wash dishes   Baseline progressing in HEP - but still needed educations   Time 3   Period Weeks   Status On-going     OT SHORT TERM GOAL #3   Title R wrist sup/pro and wrist extention improve with more than30 degrees to turn doorknob and pages, use sweeper duster   Baseline wrist ext improve - sup but not pronation    Time 3   Period Weeks   Status On-going           OT Long Term Goals - 06/28/16 1231      OT LONG TERM GOAL  #1   Title R elbow extention improve with more than 30 degrees  to  tie shoes and reach in pocket   Baseline Extention -45    Time 4   Period Weeks   Status On-going     OT LONG TERM GOAL #2   Title R elbow flexion improve with with more than 20 degrees to do hair, apply deodorant, do jewelry    Baseline R elbow this date 120    Time 4   Period Weeks   Status On-going     OT LONG TERM GOAL #3   Title R grip strength improve for pt to have more than 50% grip compare to L hand    Baseline NT - await order from MD for strengthening    Time 6   Period Weeks   Status On-going     OT LONG TERM GOAL #4   Title Function on PREE improve more than 20 points    Baseline starting to use some what    Time 4   Period Weeks   Status On-going               Plan - 06/28/16 1229    Clinical Impression Statement Pt did show some progress in elbow flexion to 120 - but extnetion , wrist ext, sup/pro about the same - pt to see surgeon Wed - await results and if order JAS splint - reinforce for pt to do more  sessions and HEP    Rehab Potential Good   OT Frequency 3x / week   OT Duration 4 weeks   OT Treatment/Interventions Self-care/ADL training;Moist Heat;Fluidtherapy;Splinting;Patient/family education;Therapeutic exercises;Contrast Bath;Ultrasound;Scar mobilization;Passive range of motion;Parrafin;Manual Therapy   Plan If surgeon ordered JAS splint - results from follow up check    OT Home Exercise Plan see pt instruction    Consulted and Agree with Plan of Care Patient      Patient will benefit from skilled therapeutic intervention in order to improve the following deficits and impairments:  Decreased range of motion, Impaired flexibility, Increased edema, Decreased knowledge of precautions, Impaired UE functional use, Pain, Decreased strength  Visit Diagnosis: Stiffness of right elbow, not elsewhere classified  Stiffness of right hand, not elsewhere classified  Stiffness of  right wrist, not elsewhere classified  Numbness and tingling of hand  Muscle weakness (generalized)    Problem List Patient Active Problem List   Diagnosis Date Noted  . 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 Atypical ductal hyperplasia, breast 09/26/2012    Class: Diagnosis of    Rosalyn Gess OTR/L,CLT 06/28/2016, 12:33 PM  [place] [facility] [address] [place], [address] Phone: [phone]   Fax:  [phone]  Name: Megan Hodge MRN: [id number] Date of Birth: [birth year]

## 2016-06-30 DIAGNOSIS — S52001D Unspecified fracture of upper end of right ulna, subsequent encounter for closed fracture with routine healing: Secondary | ICD-10-CM | POA: Diagnosis not present

## 2016-06-30 DIAGNOSIS — Y33XXXD Other specified events, undetermined intent, subsequent encounter: Secondary | ICD-10-CM | POA: Diagnosis not present

## 2016-06-30 DIAGNOSIS — S52101D Unspecified fracture of upper end of right radius, subsequent encounter for closed fracture with routine healing: Secondary | ICD-10-CM | POA: Diagnosis not present

## 2016-07-01 ENCOUNTER — Ambulatory Visit: Payer: PPO | Admitting: Occupational Therapy

## 2016-07-01 DIAGNOSIS — M6281 Muscle weakness (generalized): Secondary | ICD-10-CM

## 2016-07-01 DIAGNOSIS — M25621 Stiffness of right elbow, not elsewhere classified: Secondary | ICD-10-CM | POA: Diagnosis not present

## 2016-07-01 DIAGNOSIS — M25641 Stiffness of right hand, not elsewhere classified: Secondary | ICD-10-CM

## 2016-07-01 DIAGNOSIS — M25631 Stiffness of right wrist, not elsewhere classified: Secondary | ICD-10-CM

## 2016-07-01 DIAGNOSIS — R202 Paresthesia of skin: Secondary | ICD-10-CM

## 2016-07-01 DIAGNOSIS — R200 Anesthesia of skin: Secondary | ICD-10-CM

## 2016-07-01 NOTE — Patient Instructions (Signed)
To do sessions for elbow felxion , ext  Supination and pronation   Ed husband on PROM or stretch to elbow extention against wall   Pt ask to demo again -on YTB wrap around elbow flexion to stretch when watching tv - or using towel and pull behind back - stretch to posterior neck  Flexion glove done this date 2 x 3 min with wrist supported at neutral Followed by AROM blocked Intrinsic fist  Full fist to 4 cm foam block , and AAROM to 2 foam block  pt ed on use - 3 x day  Left message and fax info to rep for [place] splint for elbow and forearm

## 2016-07-01 NOTE — Therapy (Signed)
[facility] [address], [place], [address] Phone: [phone]   Fax:  [phone]  Occupational Therapy Treatment  Patient Details  Name: Megan Hodge MRN: [id number] Date of Birth: [birth year] Referring Provider: Celesta Aver  Encounter Date: 07/01/2016      OT End of Session - 07/01/16 1228    Visit Number 11   Number of Visits 18   Date for OT Re-Evaluation 07/12/16   OT Start Time 1115   OT Stop Time 1216   OT Time Calculation (min) 61 min   Activity Tolerance Patient tolerated treatment well   Behavior During Therapy [facility] for tasks assessed/performed      Past Medical History:  Diagnosis Date  . Abnormal mammogram, unspecified    right breast  . Cancer ([place]) 1968   cervical cancer  . Hypertension 1995    Past Surgical History:  Procedure Laterality Date  . ABDOMINAL HYSTERECTOMY  1993  . APPENDECTOMY  1993  . BREAST BIOPSY Right 1992  . BREAST BIOPSY Right March 2014   Atypical ductal hyperplasia. Failed trial of tamoxifen for chemoprevention.  . COLONOSCOPY  08/2014   Dr. Vira Agar  . DILATION AND CURETTAGE OF UTERUS  1968    There were no vitals filed for this visit.      Subjective Assessment - 07/01/16 1221    Subjective  I seen surgeon yesterday - gave me new order and he wants this Lantz splint for my elbow that included wrist -    Patient Stated Goals Want to use my R dominant hand like prior to ROM - brushing teeth, eating , cutting food, phone , fixing hair,  bathing , dressing    Currently in Pain? No/denies                      OT Treatments/Exercises (OP) - 07/01/16 0001      RUE Paraffin   Number Minutes Paraffin 10 Minutes   RUE Paraffin Location Forearm   Comments Hand , wrist , elbow - at [facility] to increase extention - large heatingpad on for extenttio stretch       Measure elbow ext -45 , coming in flexion 125   Paraffin done to elbow and hand  - large heatingpad over elbow for extention stretch- need pillow under hand and rolled up towel under upper arm - to decrease compression on elbow AAROM for composite fist to 4 cm foam block  2 cm foam block  10 reps    Prolonged elbow extention stretch -with myofascial stretch in combination 3x 30 sec  AROM after that to 35 degrees And rhitmic stretch for elbow extention 10 reps to hip  ROM for wrist extention -and prolonged extention stretch in combination digits extention  contract and relax elbow extention - PROM at end range 10 reps  Place and hold into elbow extention and - YTB pull apply - 10 reps   Ed husband on PROM or stretch to elbow extention against wall   Pt ask to demo again -on YTB wrap around elbow flexion to stretch when watching tv - or using towel and pull behind back - stretch to posterior neck  Flexion glove done this date 2 x 3 min with wrist supported at neutral Followed by AROM blocked Intrinsic fist  Full fist to 4 cm foam block , and AAROM to 2 foam block  pt ed on use - 3 x day  Left message  and fax info to rep for Lantz splint for elbow and forearm              OT Education - 07/01/16 1228    Education provided Yes   Education Details HEP changes and flexion glove use   Person(s) Educated Patient   Methods Explanation;Demonstration;Tactile cues;Verbal cues   Comprehension Verbal cues required;Returned demonstration;Verbalized understanding          OT Short Term Goals - 06/28/16 1231      OT SHORT TERM GOAL #1   Title R hand digits flexion able to touch palm to hold toothbrush and  utencils   Baseline PRogressing - see flowsheet   Time 3   Period Weeks   Status On-going     OT SHORT TERM GOAL #2   Title Pt independing in HEP to decrease scar tissue, increase  digits and wrist AROM to turn doorknob, do puzzles, wash dishes   Baseline progressing in HEP - but still needed educations   Time 3   Period Weeks   Status On-going      OT SHORT TERM GOAL #3   Title R wrist sup/pro and wrist extention improve with more than30 degrees to turn doorknob and pages, use sweeper duster   Baseline wrist ext improve - sup but not pronation    Time 3   Period Weeks   Status On-going           OT Long Term Goals - 06/28/16 1231      OT LONG TERM GOAL #1   Title R elbow extention improve with more than 30 degrees  to  tie shoes and reach in pocket   Baseline Extention -45    Time 4   Period Weeks   Status On-going     OT LONG TERM GOAL #2   Title R elbow flexion improve with with more than 20 degrees to do hair, apply deodorant, do jewelry    Baseline R elbow this date 120    Time 4   Period Weeks   Status On-going     OT LONG TERM GOAL #3   Title R grip strength improve for pt to have more than 50% grip compare to L hand    Baseline NT - await order from MD for strengthening    Time 6   Period Weeks   Status On-going     OT LONG TERM GOAL #4   Title Function on PREE improve more than 20 points    Baseline starting to use some what    Time 4   Period Weeks   Status On-going               Plan - 07/01/16 1229    Clinical Impression Statement Pt cont to show decrease ROM and increase stiffness in flexion , ext ,sup, pronation - and wrist extention - MD ordered Lantz splint - left message for rep and insurance info fax - pt fitted with flexion glove this date to use at home and  to do PROM and stretches for extention ( husband to help   Rehab Potential Good   OT Frequency 3x / week   OT Duration 4 weeks   OT Treatment/Interventions Self-care/ADL training;Moist Heat;Fluidtherapy;Splinting;Patient/family education;Therapeutic exercises;Contrast Bath;Ultrasound;Scar mobilization;Passive range of motion;Parrafin;Manual Therapy   Plan check with Rep about Lantz splint    OT Home Exercise Plan see pt instruction    Consulted and Agree with Plan of Care Patient  Patient will benefit from skilled  therapeutic intervention in order to improve the following deficits and impairments:  Decreased range of motion, Impaired flexibility, Increased edema, Decreased knowledge of precautions, Impaired UE functional use, Pain, Decreased strength  Visit Diagnosis: Stiffness of right elbow, not elsewhere classified  Stiffness of right hand, not elsewhere classified  Stiffness of right wrist, not elsewhere classified  Numbness and tingling of hand  Muscle weakness (generalized)    Problem List Patient Active Problem List   Diagnosis Date Noted  . 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 Atypical ductal hyperplasia, breast 09/26/2012    Class: Diagnosis of    Rosalyn Gess OTR/L,CLT 07/01/2016, 12:31 PM  [facility] [address], [place], [address] Phone: [phone]   Fax:  [phone]  Name: SAMYRAH PELAGIO MRN: [id number] Date of Birth: [birth year]

## 2016-07-08 ENCOUNTER — Ambulatory Visit: Payer: PPO | Admitting: Occupational Therapy

## 2016-07-08 DIAGNOSIS — M25641 Stiffness of right hand, not elsewhere classified: Secondary | ICD-10-CM

## 2016-07-08 DIAGNOSIS — M25621 Stiffness of right elbow, not elsewhere classified: Secondary | ICD-10-CM

## 2016-07-08 DIAGNOSIS — M25631 Stiffness of right wrist, not elsewhere classified: Secondary | ICD-10-CM

## 2016-07-08 DIAGNOSIS — R202 Paresthesia of skin: Secondary | ICD-10-CM

## 2016-07-08 DIAGNOSIS — M6281 Muscle weakness (generalized): Secondary | ICD-10-CM

## 2016-07-08 DIAGNOSIS — R200 Anesthesia of skin: Secondary | ICD-10-CM

## 2016-07-08 NOTE — Patient Instructions (Addendum)
   Pt to cont with HEP several times during day

## 2016-07-08 NOTE — Therapy (Signed)
[facility] [address] [place], [address] Phone: [phone]   Fax:  [phone]  Occupational Therapy Treatment  Patient Details  Name: Megan Hodge MRN: [id number] Date of Birth: 12-May-1946 Referring Provider: Celesta Aver  Encounter Date: 07/08/2016      OT End of Session - 07/08/16 1328    Visit Number 12   Number of Visits 18   Date for OT Re-Evaluation 07/12/16   OT Start Time [id number]   OT Stop Time 1401   OT Time Calculation (min) 63 min   Activity Tolerance Patient tolerated treatment well   Behavior During Therapy [facility] for tasks assessed/performed      Past Medical History:  Diagnosis Date  . Abnormal mammogram, unspecified    right breast  . Cancer ([place]) 1968   cervical cancer  . Hypertension 1995    Past Surgical History:  Procedure Laterality Date  . ABDOMINAL HYSTERECTOMY  1993  . APPENDECTOMY  1993  . BREAST BIOPSY Right 1992  . BREAST BIOPSY Right March 2014   Atypical ductal hyperplasia. Failed trial of tamoxifen for chemoprevention.  . COLONOSCOPY  08/2014   Dr. Vira Agar  . DILATION AND CURETTAGE OF UTERUS  1968    There were no vitals filed for this visit.      Subjective Assessment - 07/08/16 1326    Subjective  I have $330 copay for the splint for elbow - she phoned me - I did not do a lot over the weekend - so everything about the same    Patient Stated Goals Want to use my R dominant hand like prior to ROM - brushing teeth, eating , cutting food, phone , fixing hair,  bathing , dressing    Currently in Pain? No/denies                      OT Treatments/Exercises (OP) - 07/08/16 0001      RUE Paraffin   Number Minutes Paraffin 10 Minutes   RUE Paraffin Location Forearm   Comments hand and wrist included with large heatingpad for elbow extention stretch at [facility]     Measure elbow ext -40 , coming in flexion 120  Measured for ESP Lantz splint for  elbow extention/flexion/sup/pro - emailed to rep  Paraffin done to elbow and hand - large heatingpad over elbow for extention stretch- need pillow under hand and rolled up towel under upper arm - to decrease compression on elbow Focus this date on soft tissue mobs using tool nr 2 Graston tools - on volar wrist and forearm - with wrist extention stretch - pt extremely tight  Reviewed with pt flexion glove wearing again - 2 min  X 2 followed by AAROM for composite fist to 4 cm foam block ;2 cm foam block after each one 10 reps    Prolonged elbow extention stretch -with myofascial stretch in combination 3x 30 sec in standing this date  And rhitmic stretch for elbow extention 10 reps to hip Place and hold into elbow extention and - YTB with 5 inch object posterior to upper arm to decrease shoulder extention compensation- 10 reps  End range -35 and PROM 30 this date    Pt to cont with HEP several times during day              OT Education - 07/08/16 1328    Education provided Yes   Education Details HEP reviewed again  Person(s) Educated Patient   Methods Explanation;Demonstration;Tactile cues;Verbal cues   Comprehension Verbal cues required;Verbalized understanding;Returned demonstration          OT Short Term Goals - 06/28/16 1231      OT SHORT TERM GOAL #1   Title R hand digits flexion able to touch palm to hold toothbrush and  utencils   Baseline PRogressing - see flowsheet   Time 3   Period Weeks   Status On-going     OT SHORT TERM GOAL #2   Title Pt independing in HEP to decrease scar tissue, increase  digits and wrist AROM to turn doorknob, do puzzles, wash dishes   Baseline progressing in HEP - but still needed educations   Time 3   Period Weeks   Status On-going     OT SHORT TERM GOAL #3   Title R wrist sup/pro and wrist extention improve with more than30 degrees to turn doorknob and pages, use sweeper duster   Baseline wrist ext improve - sup but  not pronation    Time 3   Period Weeks   Status On-going           OT Long Term Goals - 06/28/16 1231      OT LONG TERM GOAL #1   Title R elbow extention improve with more than 30 degrees  to  tie shoes and reach in pocket   Baseline Extention -45    Time 4   Period Weeks   Status On-going     OT LONG TERM GOAL #2   Title R elbow flexion improve with with more than 20 degrees to do hair, apply deodorant, do jewelry    Baseline R elbow this date 120    Time 4   Period Weeks   Status On-going     OT LONG TERM GOAL #3   Title R grip strength improve for pt to have more than 50% grip compare to L hand    Baseline NT - await order from MD for strengthening    Time 6   Period Weeks   Status On-going     OT LONG TERM GOAL #4   Title Function on PREE improve more than 20 points    Baseline starting to use some what    Time 4   Period Weeks   Status On-going               Plan - 07/08/16 1328    Clinical Impression Statement Pt cont to show extreme stiffness in wrist extention , sup/pro; elbow flexion and extention - and digits - pt do need encouragement to do HEP at home - admit at times did not do a lot - did measure pt for ESP Lantz  splint this date    Rehab Potential Good   OT Frequency 3x / week   OT Duration 4 weeks   OT Treatment/Interventions Self-care/ADL training;Moist Heat;Fluidtherapy;Splinting;Patient/family education;Therapeutic exercises;Contrast Bath;Ultrasound;Scar mobilization;Passive range of motion;Parrafin;Manual Therapy   Plan Cont to increase ROM - focus again on volar forearm    OT Home Exercise Plan see pt instruction    Consulted and Agree with Plan of Care Patient      Patient will benefit from skilled therapeutic intervention in order to improve the following deficits and impairments:  Decreased range of motion, Impaired flexibility, Increased edema, Decreased knowledge of precautions, Impaired UE functional use, Pain, Decreased  strength  Visit Diagnosis: Stiffness of right elbow, not elsewhere classified  Stiffness of right hand,  not elsewhere classified  Stiffness of right wrist, not elsewhere classified  Numbness and tingling of hand  Muscle weakness (generalized)    Problem List Patient Active Problem List   Diagnosis Date Noted  . 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 Atypical ductal hyperplasia, breast 09/26/2012    Class: Diagnosis of    Rosalyn Gess OTR/L,CLT 07/08/2016, 4:19 PM  [facility] [address] [place], [address] Phone: [phone]   Fax:  [phone]  Name: Megan Hodge MRN: [id number] Date of Birth: 1945-10-01

## 2016-07-09 ENCOUNTER — Ambulatory Visit: Payer: PPO | Admitting: Occupational Therapy

## 2016-07-09 DIAGNOSIS — M25641 Stiffness of right hand, not elsewhere classified: Secondary | ICD-10-CM

## 2016-07-09 DIAGNOSIS — M25621 Stiffness of right elbow, not elsewhere classified: Secondary | ICD-10-CM

## 2016-07-09 DIAGNOSIS — M6281 Muscle weakness (generalized): Secondary | ICD-10-CM

## 2016-07-09 DIAGNOSIS — R200 Anesthesia of skin: Secondary | ICD-10-CM

## 2016-07-09 DIAGNOSIS — M25631 Stiffness of right wrist, not elsewhere classified: Secondary | ICD-10-CM

## 2016-07-09 DIAGNOSIS — R202 Paresthesia of skin: Secondary | ICD-10-CM

## 2016-07-09 NOTE — Therapy (Signed)
[facility] [address], [place], [address] Phone: [phone]   Fax:  [phone]  Occupational Therapy Treatment  Patient Details  Name: Megan Hodge MRN: [id number] Date of Birth: 04/17/1946 Referring Provider: Celesta Aver  Encounter Date: 06/24/2016      OT End of Session - 07/09/16 1302    Visit Number 13   Number of Visits 18   Date for OT Re-Evaluation 07/12/16   OT Start Time 1111   OT Stop Time 1203   OT Time Calculation (min) 52 min   Activity Tolerance Patient tolerated treatment well   Behavior During Therapy [facility] for tasks assessed/performed      Past Medical History:  Diagnosis Date  . Abnormal mammogram, unspecified    right breast  . Cancer ([place]) 1968   cervical cancer  . Hypertension 1995    Past Surgical History:  Procedure Laterality Date  . ABDOMINAL HYSTERECTOMY  1993  . APPENDECTOMY  1993  . BREAST BIOPSY Right 1992  . BREAST BIOPSY Right March 2014   Atypical ductal hyperplasia. Failed trial of tamoxifen for chemoprevention.  . COLONOSCOPY  08/2014   Dr. Vira Agar  . DILATION AND CURETTAGE OF UTERUS  1968    There were no vitals filed for this visit.      Subjective Assessment - 07/09/16 1300    Subjective  Doing okay - did some of the exercisess -maybe it is not going to get better - my hand feels little better   Patient Stated Goals Want to use my R dominant hand like prior to ROM - brushing teeth, eating , cutting food, phone , fixing hair,  bathing , dressing    Currently in Pain? No/denies                              OT Education - 07/09/16 1302    Education provided Yes   Education Details HEP update   Person(s) Educated Patient   Methods Explanation;Demonstration;Verbal cues;Tactile cues   Comprehension Returned demonstration;Verbal cues required;Verbalized understanding          OT Short Term Goals - 06/28/16 1231      OT  SHORT TERM GOAL #1   Title R hand digits flexion able to touch palm to hold toothbrush and  utencils   Baseline PRogressing - see flowsheet   Time 3   Period Weeks   Status On-going     OT SHORT TERM GOAL #2   Title Pt independing in HEP to decrease scar tissue, increase  digits and wrist AROM to turn doorknob, do puzzles, wash dishes   Baseline progressing in HEP - but still needed educations   Time 3   Period Weeks   Status On-going     OT SHORT TERM GOAL #3   Title R wrist sup/pro and wrist extention improve with more than30 degrees to turn doorknob and pages, use sweeper duster   Baseline wrist ext improve - sup but not pronation    Time 3   Period Weeks   Status On-going           OT Long Term Goals - 06/28/16 1231      OT LONG TERM GOAL #1   Title R elbow extention improve with more than 30 degrees  to  tie shoes and reach in pocket   Baseline Extention -45    Time 4   Period  Weeks   Status On-going     OT LONG TERM GOAL #2   Title R elbow flexion improve with with more than 20 degrees to do hair, apply deodorant, do jewelry    Baseline R elbow this date 120    Time 4   Period Weeks   Status On-going     OT LONG TERM GOAL #3   Title R grip strength improve for pt to have more than 50% grip compare to L hand    Baseline NT - await order from MD for strengthening    Time 6   Period Weeks   Status On-going     OT LONG TERM GOAL #4   Title Function on PREE improve more than 20 points    Baseline starting to use some what    Time 4   Period Weeks   Status On-going               Plan - 07/09/16 1303    Clinical Impression Statement Pt made progress in flexion in sesssion to 130 - and walked in ext -40 this date - supination imprving more than pronation - did add light blue putty for gripping after flexion glove and AAROm fisitng to be done    Rehab Potential Good   OT Frequency 3x / week   OT Duration 2 weeks   OT Treatment/Interventions  Self-care/ADL training;Moist Heat;Fluidtherapy;Splinting;Patient/family education;Therapeutic exercises;Contrast Bath;Ultrasound;Scar mobilization;Passive range of motion;Parrafin;Manual Therapy   Plan check on Lantz splint - and progress   OT Home Exercise Plan see pt instruction    Consulted and Agree with Plan of Care Patient      Patient will benefit from skilled therapeutic intervention in order to improve the following deficits and impairments:  Decreased range of motion, Impaired flexibility, Increased edema, Decreased knowledge of precautions, Impaired UE functional use, Pain, Decreased strength  Visit Diagnosis: Stiffness of right elbow, not elsewhere classified  Stiffness of right hand, not elsewhere classified  Stiffness of right wrist, not elsewhere classified  Numbness and tingling of hand  Muscle weakness (generalized)    Problem List Patient Active Problem List   Diagnosis Date Noted  . 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 Atypical ductal hyperplasia, breast 09/26/2012    Class: Diagnosis of    Rosalyn Gess 07/09/2016, 1:06 PM  [facility] [address], [place], [address] Phone: [phone]   Fax:  [phone]  Name: Megan Hodge MRN: [id number] Date of Birth: 04/15/1946

## 2016-07-09 NOTE — Therapy (Signed)
[place] [facility] [address] [place], [address] Phone: [phone]   Fax:  [phone]  Occupational Therapy Treatment  Patient Details  Name: Megan Hodge MRN: [id number] Date of Birth: [birth year] Referring Provider: Celesta Aver  Encounter Date: 07/09/2016      OT End of Session - 07/09/16 1302    Visit Number 13   Number of Visits 18   Date for OT Re-Evaluation 07/12/16   OT Start Time 1111   OT Stop Time 1203   OT Time Calculation (min) 52 min   Activity Tolerance Patient tolerated treatment well   Behavior During Therapy [facility] for tasks assessed/performed      Past Medical History:  Diagnosis Date  . Abnormal mammogram, unspecified    right breast  . Cancer ([place]) 1968   cervical cancer  . Hypertension 1995    Past Surgical History:  Procedure Laterality Date  . ABDOMINAL HYSTERECTOMY  1993  . APPENDECTOMY  1993  . BREAST BIOPSY Right 1992  . BREAST BIOPSY Right March 2014   Atypical ductal hyperplasia. Failed trial of tamoxifen for chemoprevention.  . COLONOSCOPY  08/2014   Dr. Vira Agar  . DILATION AND CURETTAGE OF UTERUS  1968    There were no vitals filed for this visit.      Subjective Assessment - 07/09/16 1300    Subjective  Doing okay - did some of the exercisess -maybe it is not going to get better - my hand feels little better   Patient Stated Goals Want to use my R dominant hand like prior to ROM - brushing teeth, eating , cutting food, phone , fixing hair,  bathing , dressing    Currently in Pain? No/denies       OT Treatments/Exercises (OP) - 07/09/16 0001            RUE Paraffin   Number Minutes Paraffin 10 Minutes   RUE Paraffin Location Hand   Comments hand and wrist ,elbow - in flexion stretch 0- with yellow band and large heatinpad       Measure ext -40 at [facility] , flexion at end of session 120 degrees  Paraffin done to elbow and hand  Prolong flexion  stretch for elbow using large heatinpad - adjusted 2 x during time and yellow theraband wrap for increaes flexion   PROM and prolonged flexion stretch for elbow by OT 8 x each Contract and relax - with stretch end range 10 reps  1 lbs weight in hand with flexion glove - 10 reps to back of head and up  Then from extention to touching R ear - 10 reps - 1 lbs weight  PROM for pronation and supination - followed by AAROM end range 10 reps  CPM for wrist extention 20 degrees - 200 sec  PROM MC flexion - composite flexion to palm  Light blue putty for gripping - 10 reps   add to HEP    Taped scar with kinesiotape - focus on middle part adhere still - 20 % pull parallel  And across 3 at 100% pull  Pt to take off in 48 hrs                           OT Education - 07/09/16 1302    Education provided Yes   Education Details HEP update   Person(s) Educated Patient   Methods Explanation;Demonstration;Verbal cues;Tactile cues   Comprehension  Returned demonstration;Verbal cues required;Verbalized understanding          OT Short Term Goals - 06/28/16 1231      OT SHORT TERM GOAL #1   Title R hand digits flexion able to touch palm to hold toothbrush and  utencils   Baseline PRogressing - see flowsheet   Time 3   Period Weeks   Status On-going     OT SHORT TERM GOAL #2   Title Pt independing in HEP to decrease scar tissue, increase  digits and wrist AROM to turn doorknob, do puzzles, wash dishes   Baseline progressing in HEP - but still needed educations   Time 3   Period Weeks   Status On-going     OT SHORT TERM GOAL #3   Title R wrist sup/pro and wrist extention improve with more than30 degrees to turn doorknob and pages, use sweeper duster   Baseline wrist ext improve - sup but not pronation    Time 3   Period Weeks   Status On-going           OT Long Term Goals - 06/28/16 1231      OT LONG TERM GOAL #1   Title R elbow extention improve with more  than 30 degrees  to  tie shoes and reach in pocket   Baseline Extention -45    Time 4   Period Weeks   Status On-going     OT LONG TERM GOAL #2   Title R elbow flexion improve with with more than 20 degrees to do hair, apply deodorant, do jewelry    Baseline R elbow this date 120    Time 4   Period Weeks   Status On-going     OT LONG TERM GOAL #3   Title R grip strength improve for pt to have more than 50% grip compare to L hand    Baseline NT - await order from MD for strengthening    Time 6   Period Weeks   Status On-going     OT LONG TERM GOAL #4   Title Function on PREE improve more than 20 points    Baseline starting to use some what    Time 4   Period Weeks   Status On-going               Plan - 07/09/16 1303    Clinical Impression Statement Pt made progress in flexion in sesssion to 130 - and walked in ext -40 this date - supination imprving more than pronation - did add light blue putty for gripping after flexion glove and AAROm fisitng to be done    Rehab Potential Good   OT Frequency 3x / week   OT Duration 2 weeks   OT Treatment/Interventions Self-care/ADL training;Moist Heat;Fluidtherapy;Splinting;Patient/family education;Therapeutic exercises;Contrast Bath;Ultrasound;Scar mobilization;Passive range of motion;Parrafin;Manual Therapy   Plan check on Lantz splint - and progress   OT Home Exercise Plan see pt instruction    Consulted and Agree with Plan of Care Patient      Patient will benefit from skilled therapeutic intervention in order to improve the following deficits and impairments:  Decreased range of motion, Impaired flexibility, Increased edema, Decreased knowledge of precautions, Impaired UE functional use, Pain, Decreased strength  Visit Diagnosis: Stiffness of right elbow, not elsewhere classified  Stiffness of right hand, not elsewhere classified  Stiffness of right wrist, not elsewhere classified  Numbness and tingling of  hand  Muscle weakness (generalized)  Problem List Patient Active Problem List   Diagnosis Date Noted  . 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 Atypical ductal hyperplasia, breast 09/26/2012    Class: Diagnosis of    Rosalyn Gess  OTR/L,CLT 07/09/2016, 1:05 PM  [place] [facility] [address] [place], [address] Phone: [phone]   Fax:  [phone]  Name: Megan Hodge MRN: [id number] Date of Birth: [birth year]

## 2016-07-09 NOTE — Patient Instructions (Signed)
Pt to focus on PROM  Elbow flexion , extention  1 lbs against wall for flexion and extention ( side to R ear or behind head to collar )  Cont with flexion glove - and AROM and AAROM to foam blocks - add light blue putty for grip strength - 15 reps   Cont with others for sup/pro and wrist ext

## 2016-07-09 NOTE — Therapy (Signed)
[place] [facility] [address], [place], [address] Phone: [phone]   Fax:  [phone]  Occupational Therapy Treatment  Patient Details  Name: Megan Hodge MRN: [id number] Date of Birth: Jun 22, 1946 Referring Provider: Celesta Aver  Encounter Date: 06/24/2016      OT End of Session - 07/09/16 1302    Visit Number 13   Number of Visits 18   Date for OT Re-Evaluation 07/12/16   OT Start Time 1111   OT Stop Time 1203   OT Time Calculation (min) 52 min   Activity Tolerance Patient tolerated treatment well   Behavior During Therapy [facility] for tasks assessed/performed      Past Medical History:  Diagnosis Date  . Abnormal mammogram, unspecified    right breast  . Cancer ([place]) 1968   cervical cancer  . Hypertension 1995    Past Surgical History:  Procedure Laterality Date  . ABDOMINAL HYSTERECTOMY  1993  . APPENDECTOMY  1993  . BREAST BIOPSY Right 1992  . BREAST BIOPSY Right March 2014   Atypical ductal hyperplasia. Failed trial of tamoxifen for chemoprevention.  . COLONOSCOPY  08/2014   Dr. Vira Agar  . DILATION AND CURETTAGE OF UTERUS  1968    There were no vitals filed for this visit.      Subjective Assessment - 07/09/16 1300    Subjective  Doing okay - did some of the exercisess -maybe it is not going to get better - my hand feels little better   Patient Stated Goals Want to use my R dominant hand like prior to ROM - brushing teeth, eating , cutting food, phone , fixing hair,  bathing , dressing    Currently in Pain? No/denies                      OT Treatments/Exercises (OP) - 07/09/16 0001      RUE Paraffin   Number Minutes Paraffin 10 Minutes   RUE Paraffin Location Hand   Comments hand and wrist ,elbow - in flexion stretch 0- with yellow band and large heatinpad       Measure ext -40 at [facility] , flexion at end of session 120 degrees  Paraffin done to elbow and hand   Prolong flexion stretch for elbow using large heatinpad - adjusted 2 x during time and yellow theraband wrap for increaes flexion   PROM and prolonged flexion stretch for elbow by OT 8 x each Contract and relax - with stretch end range 10 reps  1 lbs weight in hand with flexion glove - 10 reps to back of head and up  Then from extention to touching R ear - 10 reps - 1 lbs weight  PROM for pronation and supination - followed by AAROM end range 10 reps  CPM for wrist extention 20 degrees - 200 sec   PROM MC flexion - composite flexion to palm  Light blue putty for gripping - 10 reps   add to HEP    Taped scar with kinesiotape - focus on middle part adhere still - 20 % pull parallel  And across 3 at 100% pull  Pt to take off in 48 hrs                OT Education - 07/09/16 1302    Education provided Yes   Education Details HEP update   Person(s) Educated Patient   Methods Explanation;Demonstration;Verbal cues;Tactile cues   Comprehension Returned  demonstration;Verbal cues required;Verbalized understanding          OT Short Term Goals - 06/28/16 1231      OT SHORT TERM GOAL #1   Title R hand digits flexion able to touch palm to hold toothbrush and  utencils   Baseline PRogressing - see flowsheet   Time 3   Period Weeks   Status On-going     OT SHORT TERM GOAL #2   Title Pt independing in HEP to decrease scar tissue, increase  digits and wrist AROM to turn doorknob, do puzzles, wash dishes   Baseline progressing in HEP - but still needed educations   Time 3   Period Weeks   Status On-going     OT SHORT TERM GOAL #3   Title R wrist sup/pro and wrist extention improve with more than30 degrees to turn doorknob and pages, use sweeper duster   Baseline wrist ext improve - sup but not pronation    Time 3   Period Weeks   Status On-going           OT Long Term Goals - 06/28/16 1231      OT LONG TERM GOAL #1   Title R elbow extention improve with more  than 30 degrees  to  tie shoes and reach in pocket   Baseline Extention -45    Time 4   Period Weeks   Status On-going     OT LONG TERM GOAL #2   Title R elbow flexion improve with with more than 20 degrees to do hair, apply deodorant, do jewelry    Baseline R elbow this date 120    Time 4   Period Weeks   Status On-going     OT LONG TERM GOAL #3   Title R grip strength improve for pt to have more than 50% grip compare to L hand    Baseline NT - await order from MD for strengthening    Time 6   Period Weeks   Status On-going     OT LONG TERM GOAL #4   Title Function on PREE improve more than 20 points    Baseline starting to use some what    Time 4   Period Weeks   Status On-going               Plan - 07/09/16 1303    Clinical Impression Statement Pt made progress in flexion in sesssion to 130 - and walked in ext -40 this date - supination imprving more than pronation - did add light blue putty for gripping after flexion glove and AAROm fisitng to be done    Rehab Potential Good   OT Frequency 3x / week   OT Duration 2 weeks   OT Treatment/Interventions Self-care/ADL training;Moist Heat;Fluidtherapy;Splinting;Patient/family education;Therapeutic exercises;Contrast Bath;Ultrasound;Scar mobilization;Passive range of motion;Parrafin;Manual Therapy   Plan check on Lantz splint - and progress   OT Home Exercise Plan see pt instruction    Consulted and Agree with Plan of Care Patient      Patient will benefit from skilled therapeutic intervention in order to improve the following deficits and impairments:  Decreased range of motion, Impaired flexibility, Increased edema, Decreased knowledge of precautions, Impaired UE functional use, Pain, Decreased strength  Visit Diagnosis: Stiffness of right elbow, not elsewhere classified  Stiffness of right hand, not elsewhere classified  Stiffness of right wrist, not elsewhere classified  Numbness and tingling of  hand  Muscle weakness (generalized)    Problem  List Patient Active Problem List   Diagnosis Date Noted  . 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 Atypical ductal hyperplasia, breast 09/26/2012    Class: Diagnosis of    Rosalyn Gess OTR/L,CLT 07/09/2016, 1:07 PM  [place] [facility] [address], [place], [address] Phone: [phone]   Fax:  [phone]  Name: Megan Hodge MRN: [id number] Date of Birth: 05/15/1946

## 2016-07-14 ENCOUNTER — Ambulatory Visit: Payer: PPO | Attending: Student | Admitting: Occupational Therapy

## 2016-07-14 DIAGNOSIS — M6281 Muscle weakness (generalized): Secondary | ICD-10-CM | POA: Diagnosis not present

## 2016-07-14 DIAGNOSIS — R202 Paresthesia of skin: Secondary | ICD-10-CM | POA: Diagnosis not present

## 2016-07-14 DIAGNOSIS — M25621 Stiffness of right elbow, not elsewhere classified: Secondary | ICD-10-CM | POA: Insufficient documentation

## 2016-07-14 DIAGNOSIS — M25631 Stiffness of right wrist, not elsewhere classified: Secondary | ICD-10-CM | POA: Diagnosis not present

## 2016-07-14 DIAGNOSIS — R200 Anesthesia of skin: Secondary | ICD-10-CM | POA: Diagnosis not present

## 2016-07-14 DIAGNOSIS — M25641 Stiffness of right hand, not elsewhere classified: Secondary | ICD-10-CM

## 2016-07-14 NOTE — Patient Instructions (Addendum)
  Pt to cont with HEP several times during day  -

## 2016-07-14 NOTE — Therapy (Signed)
[place] [facility] [address], [place], [address] Phone: [phone]   Fax:  [phone]  Occupational Therapy Treatment  Patient Details  Name: Megan Hodge MRN: [id number] Date of Birth: 1945/11/17 Referring Provider: Celesta Aver  Encounter Date: 07/14/2016      OT End of Session - 07/14/16 1302    Visit Number 14   Number of Visits 18   Date for OT Re-Evaluation 07/12/16   OT Start Time 1243   OT Stop Time 1344   OT Time Calculation (min) 61 min   Activity Tolerance Patient tolerated treatment well   Behavior During Therapy [facility] for tasks assessed/performed      Past Medical History:  Diagnosis Date  . Abnormal mammogram, unspecified    right breast  . Cancer ([place]) 1968   cervical cancer  . Hypertension 1995    Past Surgical History:  Procedure Laterality Date  . ABDOMINAL HYSTERECTOMY  1993  . APPENDECTOMY  1993  . BREAST BIOPSY Right 1992  . BREAST BIOPSY Right March 2014   Atypical ductal hyperplasia. Failed trial of tamoxifen for chemoprevention.  . COLONOSCOPY  08/2014   Dr. Vira Agar  . DILATION AND CURETTAGE OF UTERUS  1968    There were no vitals filed for this visit.      Subjective Assessment - 07/14/16 1300    Subjective  My hand and wrist was just ache after last time - don't know if it was the machine for my wrist or the putty - we also did the weight last time    Patient Stated Goals Want to use my R dominant hand like prior to ROM - brushing teeth, eating , cutting food, phone , fixing hair,  bathing , dressing    Currently in Pain? No/denies                      OT Treatments/Exercises (OP) - 07/14/16 0001      RUE Paraffin   Number Minutes Paraffin 10 Minutes   RUE Paraffin Location Hand   Comments --  elbow , forearm and hand - with large heatingpad stretch       Pt arrive very stiff - and admit she did not do a lot since last time - that was 5 days ago    Paraffin done to elbow and hand - large heatingpad over elbow for extention stretch- need pillow under hand and rolled up towel under upper arm - to decrease compression on elbow Focus this date on soft tissue mobs  Doing myofascial release to upper arm with  Elbow extention stretch  - prolonged stretch 3 x 45 sec  Graston tools on volar forearm and wrist with PROM by OT for wrist extention prior to pronation    flexion glove  On while doing pronation wheel - focus on pronation this date - flexion glove use in conjunction with wrist splint to keep wrist neutral  4 x 15 reps - and with AAROM from pt end range for pronation on wheel   Then did flexion glove 2 x 1 min - with AROM for intrinsic fist and AAROM composite fist - prior to  composite fist to 4 cm foam block ;2 cm foam block 10 reps    And rhitmic stretch for elbow extention 10 reps to hip Place and hold into elbow extention    Pt to cont with HEP several times during day  -  OT Education - 07/14/16 1302    Education provided Yes   Education Details HEP reinforce    Person(s) Educated Patient   Methods Demonstration;Verbal cues;Explanation;Tactile cues   Comprehension Verbalized understanding;Returned demonstration;Verbal cues required          OT Short Term Goals - 06/28/16 1231      OT SHORT TERM GOAL #1   Title R hand digits flexion able to touch palm to hold toothbrush and  utencils   Baseline PRogressing - see flowsheet   Time 3   Period Weeks   Status On-going     OT SHORT TERM GOAL #2   Title Pt independing in HEP to decrease scar tissue, increase  digits and wrist AROM to turn doorknob, do puzzles, wash dishes   Baseline progressing in HEP - but still needed educations   Time 3   Period Weeks   Status On-going     OT SHORT TERM GOAL #3   Title R wrist sup/pro and wrist extention improve with more than30 degrees to turn doorknob and pages, use sweeper duster   Baseline wrist  ext improve - sup but not pronation    Time 3   Period Weeks   Status On-going           OT Long Term Goals - 06/28/16 1231      OT LONG TERM GOAL #1   Title R elbow extention improve with more than 30 degrees  to  tie shoes and reach in pocket   Baseline Extention -45    Time 4   Period Weeks   Status On-going     OT LONG TERM GOAL #2   Title R elbow flexion improve with with more than 20 degrees to do hair, apply deodorant, do jewelry    Baseline R elbow this date 120    Time 4   Period Weeks   Status On-going     OT LONG TERM GOAL #3   Title R grip strength improve for pt to have more than 50% grip compare to L hand    Baseline NT - await order from MD for strengthening    Time 6   Period Weeks   Status On-going     OT LONG TERM GOAL #4   Title Function on PREE improve more than 20 points    Baseline starting to use some what    Time 4   Period Weeks   Status On-going               Plan - 07/14/16 1302    Clinical Impression Statement Megan Hodge splint rep out of office until tomorrow- pt admit did not do to much since last time - pt need reinforcement that she need to do HEP at home - 2-3 x wk in OT is not enough - focus this date on pronation , and digits flexion -  as well as elbow extention    Rehab Potential Good   OT Frequency 3x / week   OT Duration 2 weeks   OT Treatment/Interventions Self-care/ADL training;Moist Heat;Fluidtherapy;Splinting;Patient/family education;Therapeutic exercises;Contrast Bath;Ultrasound;Scar mobilization;Passive range of motion;Parrafin;Manual Therapy   Plan Check on Megan Hodge splint if rep is back in office - if pt did HEP - detail check    OT Home Exercise Plan see pt instruction    Consulted and Agree with Plan of Care Patient      Patient will benefit from skilled therapeutic intervention in order to improve the following deficits and impairments:  Decreased range of motion, Impaired flexibility, Increased edema, Decreased  knowledge of precautions, Impaired UE functional use, Pain, Decreased strength  Visit Diagnosis: Stiffness of right elbow, not elsewhere classified  Stiffness of right hand, not elsewhere classified  Stiffness of right wrist, not elsewhere classified  Numbness and tingling of hand  Muscle weakness (generalized)    Problem List Patient Active Problem List   Diagnosis Date Noted  . 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 Atypical ductal hyperplasia, breast 09/26/2012    Class: Diagnosis of    Rosalyn Gess OTR/L,CLT 07/14/2016, 2:48 PM  [place] [facility] [address], [place], [address] Phone: [phone]   Fax:  [phone]  Name: YVELISSE CLOUTHIER MRN: [id number] Date of Birth: January 18, 1946

## 2016-07-16 ENCOUNTER — Ambulatory Visit: Payer: PPO | Admitting: Occupational Therapy

## 2016-07-16 DIAGNOSIS — M25621 Stiffness of right elbow, not elsewhere classified: Secondary | ICD-10-CM | POA: Diagnosis not present

## 2016-07-16 DIAGNOSIS — M25641 Stiffness of right hand, not elsewhere classified: Secondary | ICD-10-CM

## 2016-07-16 DIAGNOSIS — M25631 Stiffness of right wrist, not elsewhere classified: Secondary | ICD-10-CM

## 2016-07-16 DIAGNOSIS — R200 Anesthesia of skin: Secondary | ICD-10-CM

## 2016-07-16 DIAGNOSIS — R202 Paresthesia of skin: Secondary | ICD-10-CM

## 2016-07-16 DIAGNOSIS — M6281 Muscle weakness (generalized): Secondary | ICD-10-CM

## 2016-07-16 NOTE — Patient Instructions (Signed)
Same HEP  

## 2016-07-16 NOTE — Therapy (Signed)
[place] [facility] [address] [place], [address] Phone: [phone]   Fax:  [phone]  Occupational Therapy Treatment  Patient Details  Name: Megan Hodge MRN: [id number] Date of Birth: 03-08-1946 Referring Provider: Celesta Aver  Encounter Date: 07/16/2016      OT End of Session - 07/16/16 1549    Visit Number 15   Number of Visits 24   Date for OT Re-Evaluation 08/13/16   OT Start Time 1214   OT Stop Time 1302   OT Time Calculation (min) 48 min   Activity Tolerance Patient tolerated treatment well   Behavior During Therapy [facility] for tasks assessed/performed      Past Medical History:  Diagnosis Date  . Abnormal mammogram, unspecified    right breast  . Cancer ([place]) 1968   cervical cancer  . Hypertension 1995    Past Surgical History:  Procedure Laterality Date  . ABDOMINAL HYSTERECTOMY  1993  . APPENDECTOMY  1993  . BREAST BIOPSY Right 1992  . BREAST BIOPSY Right March 2014   Atypical ductal hyperplasia. Failed trial of tamoxifen for chemoprevention.  . COLONOSCOPY  08/2014   Dr. Vira Agar  . DILATION AND CURETTAGE OF UTERUS  1968    There were no vitals filed for this visit.      Subjective Assessment - 07/16/16 1544    Subjective  My hand was some what looser and able to use it after last time and since then - but with some act my fingers just get more stiff - elbow same    Patient Stated Goals Want to use my R dominant hand like prior to ROM - brushing teeth, eating , cutting food, phone , fixing hair,  bathing , dressing    Currently in Pain? No/denies                      OT Treatments/Exercises (OP) - 07/16/16 0001      RUE Paraffin   Number Minutes Paraffin 10 Minutes   RUE Paraffin Location --  hand , forearm and elbow    Comments AT SOC to increase ROM and decrease stiffness       Paraffin done to elbow and hand - large heatingpad over elbow for extention  stretch- need pillow under hand and rolled up towel under upper arm - to decrease compression on elbow Focus this date on soft tissue mobs  Doing myofascial release to upper arm with  Elbow extention stretch  - prolonged stretch 3 x 45 sec  AROM and contract relax for elbow extention in supine - 10 reps each  During which pt was wearing knuckle bender for increasing MC flexion - in combination of flexion glove  After which did AAROM to 3cm and 2 cm foam block in palm  12 reps each     flexion glove  On while doing pronation wheel - focus on pronation this date - flexion glove use in conjunction with wrist splint to keep wrist neutral  4 x 15 reps - and with AAROM from pt end range for pronation on wheel   Then did AROM for intrinsic fist and AAROM composite fist - prior to  composite fist place and hold to ;2 cm foam block 10 reps              OT Education - 07/16/16 1548    Education provided Yes   Education Details HEP reinforce for pt to do  at home    Person(s) Educated Patient   Methods Explanation;Demonstration;Tactile cues;Verbal cues   Comprehension Returned demonstration;Verbalized understanding          OT Short Term Goals - 07/16/16 1553      OT SHORT TERM GOAL #1   Title R hand digits flexion able to touch palm to hold toothbrush and  utencils   Baseline PRogressing - see flowsheet   Time 3   Period Weeks   Status On-going     OT SHORT TERM GOAL #2   Title Pt independing in HEP to decrease scar tissue, increase  digits and wrist AROM to turn doorknob, do puzzles, wash dishes   Baseline progressing in HEP - but still needed educations   Time 3   Period Weeks   Status On-going     OT SHORT TERM GOAL #3   Title R wrist sup/pro and wrist extention improve with more than30 degrees to turn doorknob and pages, use sweeper duster   Baseline wrist ext, sup and pronation still impaired   Time 3   Period Weeks   Status On-going           OT Long Term  Goals - 07/16/16 1553      OT LONG TERM GOAL #1   Title R elbow extention improve with more than 30 degrees  to  tie shoes and reach in pocket   Baseline still impaired - 40 walk in ext, PROM -30 this date   Time 4   Period Weeks   Status On-going     OT LONG TERM GOAL #2   Title R elbow flexion improve with with more than 20 degrees to do hair, apply deodorant, do jewelry    Baseline R elbow this date 120    Time 4   Period Weeks   Status On-going     OT LONG TERM GOAL #3   Title R grip strength improve for pt to have more than 50% grip compare to L hand    Baseline NT    Time 4   Period Weeks   Status On-going               Plan - 07/16/16 1550    Clinical Impression Statement Pt will be fitted on Monday with Lantz splint - was delay because of the holidays - pt progress slow - reinforce again with pt how important HEP is at home - and to do it every day -  pt admit at times she did not do to much exercises - encourage pt to use hand at home in light activities    Rehab Potential Good   OT Frequency 3x / week   OT Duration 4 weeks   OT Treatment/Interventions Self-care/ADL training;Moist Heat;Fluidtherapy;Splinting;Patient/family education;Therapeutic exercises;Contrast Bath;Ultrasound;Scar mobilization;Passive range of motion;Parrafin;Manual Therapy   Plan Lantz splint will be fitted next session by rep   OT Home Exercise Plan see pt instruction    Consulted and Agree with Plan of Care Patient      Patient will benefit from skilled therapeutic intervention in order to improve the following deficits and impairments:  Decreased range of motion, Impaired flexibility, Increased edema, Decreased knowledge of precautions, Impaired UE functional use, Pain, Decreased strength  Visit Diagnosis: Stiffness of right elbow, not elsewhere classified - Plan: Ot plan of care cert/re-cert  Stiffness of right hand, not elsewhere classified - Plan: Ot plan of care  cert/re-cert  Stiffness of right wrist, not elsewhere classified - Plan: Ot  plan of care cert/re-cert  Numbness and tingling of hand - Plan: Ot plan of care cert/re-cert  Muscle weakness (generalized) - Plan: Ot plan of care cert/re-cert    Problem List Patient Active Problem List   Diagnosis Date Noted  . 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 Atypical ductal hyperplasia, breast 09/26/2012    Class: Diagnosis of    Rosalyn Gess OTR/L,CLT 07/16/2016, 3:58 PM  [place] [facility] [address] [place], [address] Phone: [phone]   Fax:  [phone]  Name: JULIANE AAKER MRN: [id number] Date of Birth: 1945/12/07

## 2016-07-19 ENCOUNTER — Ambulatory Visit: Payer: PPO | Admitting: Occupational Therapy

## 2016-07-19 DIAGNOSIS — R202 Paresthesia of skin: Secondary | ICD-10-CM

## 2016-07-19 DIAGNOSIS — M6281 Muscle weakness (generalized): Secondary | ICD-10-CM

## 2016-07-19 DIAGNOSIS — M25631 Stiffness of right wrist, not elsewhere classified: Secondary | ICD-10-CM

## 2016-07-19 DIAGNOSIS — R200 Anesthesia of skin: Secondary | ICD-10-CM

## 2016-07-19 DIAGNOSIS — M25621 Stiffness of right elbow, not elsewhere classified: Secondary | ICD-10-CM | POA: Diagnosis not present

## 2016-07-19 DIAGNOSIS — M25641 Stiffness of right hand, not elsewhere classified: Secondary | ICD-10-CM

## 2016-07-19 DIAGNOSIS — S52001D Unspecified fracture of upper end of right ulna, subsequent encounter for closed fracture with routine healing: Secondary | ICD-10-CM | POA: Diagnosis not present

## 2016-07-19 DIAGNOSIS — S52101D Unspecified fracture of upper end of right radius, subsequent encounter for closed fracture with routine healing: Secondary | ICD-10-CM | POA: Diagnosis not present

## 2016-07-19 NOTE — Patient Instructions (Addendum)
      Lantz splint To wear 3 x 30 min   elbow extention with supination  And flexion with pronation -  Followed each session by AROM , yellow band  HEP to maintain gains  Should feel about 2-3/10 easy - increase stretch every 5 min during 30 min period  Elevated arm on pillow to comfort

## 2016-07-19 NOTE — Therapy (Signed)
[place] [facility] [address] [place], [address] Phone: [phone]   Fax:  [phone]  Occupational Therapy Treatment  Patient Details  Name: Megan Hodge MRN: [id number] Date of Birth: [birth year] Referring Provider: Celesta Aver  Encounter Date: 07/19/2016      OT End of Session - 07/19/16 1257    Visit Number 16   Number of Visits 24   Date for OT Re-Evaluation 08/13/16   OT Start Time 1144   OT Stop Time 1258   OT Time Calculation (min) 74 min   Activity Tolerance Patient tolerated treatment well   Behavior During Therapy [facility] for tasks assessed/performed      Past Medical History:  Diagnosis Date  . Abnormal mammogram, unspecified    right breast  . Cancer ([place]) 1968   cervical cancer  . Hypertension 1995    Past Surgical History:  Procedure Laterality Date  . ABDOMINAL HYSTERECTOMY  1993  . APPENDECTOMY  1993  . BREAST BIOPSY Right 1992  . BREAST BIOPSY Right March 2014   Atypical ductal hyperplasia. Failed trial of tamoxifen for chemoprevention.  . COLONOSCOPY  08/2014   Dr. Vira Agar  . DILATION AND CURETTAGE OF UTERUS  1968    There were no vitals filed for this visit.      Subjective Assessment - 07/19/16 1218    Subjective  Roll towel, using more my hand in kitchen - reaching more with my R hand -    Patient Stated Goals Want to use my R dominant hand like prior to ROM - brushing teeth, eating , cutting food, phone , fixing hair,  bathing , dressing    Currently in Pain? No/denies            [facility] OT Assessment - 07/19/16 0001      AROM   Right Elbow Flexion 130   Right Forearm Pronation 50 Degrees   Right Forearm Supination 55 Degrees   Right Wrist Extension 20 Degrees   Right Wrist Flexion 65 Degrees   Right Wrist Radial Deviation 17 Degrees   Right Wrist Ulnar Deviation 30 Degrees     Right Hand AROM   R Index  MCP 0-90 65 Degrees   R Index PIP 0-100 75 Degrees   R  Long  MCP 0-90 75 Degrees   R Long PIP 0-100 70 Degrees   R Ring  MCP 0-90 70 Degrees   R Ring PIP 0-100 65 Degrees   R Little  MCP 0-90 65 Degrees   R Little PIP 0-100 65 Degrees                  OT Treatments/Exercises (OP) - 07/19/16 0001      RUE Paraffin   Number Minutes Paraffin 10 Minutes   Comments At [facility] to increase ROM  -large heatingpad on elbow - to increase elbow extention stretch     Measured elbow flexion , wrist , sup/pro , exte and digits   see flowsheet    Paraffin done to elbow and hand - large heatingpad over elbow for extention stretch- need pillow under hand and rolled up towel under upper arm - to decrease compression on elbo  PRolonged stretch with gentle traction for elbow extention - 3 x 45 sec  AAROM wrist ext over edge of mat - gentle stretch 3 x 30 sec   Apply  flexion glove  With wrist support - 2 x 1 min with  AAROM  to 3cm and 2 cm foam block in palm  inbetween  Followed by BTE gripper at 15 lbs - 130 sec      Lantz splint Rep come to fit - assist - and assess with her appropriate fit and wear by pt  To wear 3 x 30 min   elbow extention with supination  And flexion with pronation -  Followed each session by AROM , yellow band  HEP to maintain gains  Should feel about 2-3/10 easy - increase stretch every 5 min during 30 min period             OT Education - 07/19/16 1256    Education provided Yes   Education Details  HEP changes - and LANZT splint use    Person(s) Educated Patient   Methods Explanation;Demonstration;Tactile cues;Verbal cues   Comprehension Verbal cues required;Returned demonstration;Verbalized understanding          OT Short Term Goals - 07/16/16 1553      OT SHORT TERM GOAL #1   Title R hand digits flexion able to touch palm to hold toothbrush and  utencils   Baseline PRogressing - see flowsheet   Time 3   Period Weeks   Status On-going     OT SHORT TERM GOAL #2   Title Pt independing in HEP to  decrease scar tissue, increase  digits and wrist AROM to turn doorknob, do puzzles, wash dishes   Baseline progressing in HEP - but still needed educations   Time 3   Period Weeks   Status On-going     OT SHORT TERM GOAL #3   Title R wrist sup/pro and wrist extention improve with more than30 degrees to turn doorknob and pages, use sweeper duster   Baseline wrist ext, sup and pronation still impaired   Time 3   Period Weeks   Status On-going           OT Long Term Goals - 07/16/16 1553      OT LONG TERM GOAL #1   Title R elbow extention improve with more than 30 degrees  to  tie shoes and reach in pocket   Baseline still impaired - 40 walk in ext, PROM -30 this date   Time 4   Period Weeks   Status On-going     OT LONG TERM GOAL #2   Title R elbow flexion improve with with more than 20 degrees to do hair, apply deodorant, do jewelry    Baseline R elbow this date 120    Time 4   Period Weeks   Status On-going     OT LONG TERM GOAL #3   Title R grip strength improve for pt to have more than 50% grip compare to L hand    Baseline NT    Time 4   Period Weeks   Status On-going               Plan - 07/19/16 1258    Clinical Impression Statement Pt digits flexion worse than middle Dec - pt to use flexion glove - and cont fisting - Lanzt splint fitted this date to use 3 x 30 min - flexion /pro and ext with sup - pt and husband present with me and rep to be triained in use - reinforce importance for pt to use at home as instructed    Rehab Potential Good   OT Frequency 3x / week   OT Duration 4 weeks   OT  Treatment/Interventions Self-care/ADL training;Moist Heat;Fluidtherapy;Splinting;Patient/family education;Therapeutic exercises;Contrast Bath;Ultrasound;Scar mobilization;Passive range of motion;Parrafin;Manual Therapy   Plan check on use of Lanzt splint - review if needed    OT Home Exercise Plan see pt instruction    Consulted and Agree with Plan of Care Patient       Patient will benefit from skilled therapeutic intervention in order to improve the following deficits and impairments:  Decreased range of motion, Impaired flexibility, Increased edema, Decreased knowledge of precautions, Impaired UE functional use, Pain, Decreased strength  Visit Diagnosis: Stiffness of right elbow, not elsewhere classified  Stiffness of right hand, not elsewhere classified  Stiffness of right wrist, not elsewhere classified  Numbness and tingling of hand  Muscle weakness (generalized)    Problem List Patient Active Problem List   Diagnosis Date Noted  . 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 Atypical ductal hyperplasia, breast 09/26/2012    Class: Diagnosis of    Rosalyn Gess OTR/L,CLT 07/19/2016, 2:19 PM  [place] [facility] [address] [place], [address] Phone: [phone]   Fax:  [phone]  Name: Megan Hodge MRN: [id number] Date of Birth: 01-14-1946

## 2016-07-21 ENCOUNTER — Ambulatory Visit: Payer: PPO | Admitting: Occupational Therapy

## 2016-07-21 DIAGNOSIS — N183 Chronic kidney disease, stage 3 (moderate): Secondary | ICD-10-CM | POA: Diagnosis not present

## 2016-07-21 DIAGNOSIS — E782 Mixed hyperlipidemia: Secondary | ICD-10-CM | POA: Diagnosis not present

## 2016-07-21 DIAGNOSIS — M25621 Stiffness of right elbow, not elsewhere classified: Secondary | ICD-10-CM | POA: Diagnosis not present

## 2016-07-21 DIAGNOSIS — M6281 Muscle weakness (generalized): Secondary | ICD-10-CM

## 2016-07-21 DIAGNOSIS — R200 Anesthesia of skin: Secondary | ICD-10-CM

## 2016-07-21 DIAGNOSIS — R202 Paresthesia of skin: Secondary | ICD-10-CM

## 2016-07-21 DIAGNOSIS — M25631 Stiffness of right wrist, not elsewhere classified: Secondary | ICD-10-CM

## 2016-07-21 DIAGNOSIS — M25641 Stiffness of right hand, not elsewhere classified: Secondary | ICD-10-CM

## 2016-07-21 DIAGNOSIS — I10 Essential (primary) hypertension: Secondary | ICD-10-CM | POA: Diagnosis not present

## 2016-07-21 NOTE — Therapy (Signed)
[place] [facility] [address] [place], [address] Phone: [phone]   Fax:  [phone]  Occupational Therapy Treatment  Patient Details  Name: Megan Hodge MRN: [id number] Date of Birth: 11/27/1945 Referring Provider: Celesta Aver  Encounter Date: 07/21/2016      OT End of Session - 07/21/16 1442    Visit Number 17   Number of Visits 24   Date for OT Re-Evaluation 08/13/16   OT Start Time 1316   OT Stop Time 1418   OT Time Calculation (min) 62 min   Activity Tolerance Patient tolerated treatment well   Behavior During Therapy [facility] for tasks assessed/performed      Past Medical History:  Diagnosis Date  . Abnormal mammogram, unspecified    right breast  . Cancer ([place]) 1968   cervical cancer  . Hypertension 1995    Past Surgical History:  Procedure Laterality Date  . ABDOMINAL HYSTERECTOMY  1993  . APPENDECTOMY  1993  . BREAST BIOPSY Right 1992  . BREAST BIOPSY Right March 2014   Atypical ductal hyperplasia. Failed trial of tamoxifen for chemoprevention.  . COLONOSCOPY  08/2014   Dr. Vira Agar  . DILATION AND CURETTAGE OF UTERUS  1968    There were no vitals filed for this visit.      Subjective Assessment - 07/21/16 1441    Subjective  Okay - you need to show me again how to use this elbow splint - my fingers goes numb and not feeling pull where  I am suppose too   Patient Stated Goals Want to use my R dominant hand like prior to ROM - brushing teeth, eating , cutting food, phone , fixing hair,  bathing , dressing    Currently in Pain? No/denies      Pt in need for assessment and review in using Lantz ESP splint to use at home  Upon donning splint - and fastening straps - pt 's 4th and 5th digits went numb - assess - and appear it is forearm and hand straps  Added some xtra padding (foam) to ulnar side of forearm and over Guyons canal - adjusted angle of pull of strap on hand   Pt wore splint  in elbow ext and sup - for few min -followed by supination wheel with 1lbs  And YTB for elbow extention  12 reps   Then assess pronation and elbow flexion -4th and 5th digits went numb - but decrease when elbow flexion was released -  Pt to decrease wear time for elbow flexion and focus on pronation  DId pronation wheel with 1 lbs weight 12 reps  And 1 lbs for elbow flexion behind head and in standing   HEP as follow Pt to wear ESP static -a-dynamic splint  3 sessions for flexion elbow and pronation - but start 10 min with pronation  And add 10 min later elbow flexion - and then back of on that and increase pronation another 10 min  (done because of increase numbness when flexion added) Followed by pronation wheel with 1 lbs weight 1 lbs for elbow flexion behind head , and then standing against wall  12 reps  Each  3 sessions for elbow ext and supination 30 min each   Followed by supination wheel with 1 lbs weight And YTB in standing against wall - 12 reps each  Added some xtra padding on ulnar side of forearm - to decrease numbness in ulnar side of  hand - and modify strap around hand to decrease pressure on 5th digit                          OT Education - 07/21/16 1441    Education provided Yes   Education Details USe of Lantz splint - ESP   Person(s) Educated Patient   Methods Explanation;Demonstration;Tactile cues;Verbal cues;Handout   Comprehension Verbal cues required;Returned demonstration;Verbalized understanding          OT Short Term Goals - 07/16/16 1553      OT SHORT TERM GOAL #1   Title R hand digits flexion able to touch palm to hold toothbrush and  utencils   Baseline PRogressing - see flowsheet   Time 3   Period Weeks   Status On-going     OT SHORT TERM GOAL #2   Title Pt independing in HEP to decrease scar tissue, increase  digits and wrist AROM to turn doorknob, do puzzles, wash dishes   Baseline progressing in HEP - but still  needed educations   Time 3   Period Weeks   Status On-going     OT SHORT TERM GOAL #3   Title R wrist sup/pro and wrist extention improve with more than30 degrees to turn doorknob and pages, use sweeper duster   Baseline wrist ext, sup and pronation still impaired   Time 3   Period Weeks   Status On-going           OT Long Term Goals - 07/16/16 1553      OT LONG TERM GOAL #1   Title R elbow extention improve with more than 30 degrees  to  tie shoes and reach in pocket   Baseline still impaired - 40 walk in ext, PROM -30 this date   Time 4   Period Weeks   Status On-going     OT LONG TERM GOAL #2   Title R elbow flexion improve with with more than 20 degrees to do hair, apply deodorant, do jewelry    Baseline R elbow this date 120    Time 4   Period Weeks   Status On-going     OT LONG TERM GOAL #3   Title R grip strength improve for pt to have more than 50% grip compare to L hand    Baseline NT    Time 4   Period Weeks   Status On-going               Plan - 07/21/16 1442    Clinical Impression Statement Reviewed with pt donning and doffing - and wearing of Lantz elbow ESP splint - pt report increase numbness in 4th and 5th - pt to decrease wearing for elbow flexion - but more pronation - provided xtra padding for ulnar side of forearm and hand - with less reports of numbness - pt showed elbow extention this date after splint wearing -38 AROM and PROM -32    Rehab Potential Good   OT Frequency 3x / week   OT Duration 4 weeks   OT Treatment/Interventions Self-care/ADL training;Moist Heat;Fluidtherapy;Splinting;Patient/family education;Therapeutic exercises;Contrast Bath;Ultrasound;Scar mobilization;Passive range of motion;Parrafin;Manual Therapy   Plan check tolerance of wearing of Lantz splint    OT Home Exercise Plan see pt instruction    Consulted and Agree with Plan of Care Patient      Patient will benefit from skilled therapeutic intervention in order  to improve the following deficits and impairments:  Decreased range of motion, Impaired flexibility, Increased edema, Decreased knowledge of precautions, Impaired UE functional use, Pain, Decreased strength  Visit Diagnosis: Stiffness of right elbow, not elsewhere classified  Stiffness of right hand, not elsewhere classified  Stiffness of right wrist, not elsewhere classified  Numbness and tingling of hand  Muscle weakness (generalized)    Problem List Patient Active Problem List   Diagnosis Date Noted  . 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 Atypical ductal hyperplasia, breast 09/26/2012    Class: Diagnosis of    Rosalyn Gess OTR/L,CLT 07/21/2016, 2:49 PM  [place] [facility] [address] [place], [address] Phone: [phone]   Fax:  [phone]  Name: Megan Hodge MRN: [id number] Date of Birth: September 22, 1945

## 2016-07-21 NOTE — Patient Instructions (Signed)
Pt to wear ESP static -a-dynamic splint  3 sessions for flexion elbow and pronation - but start 10 min with pronation  And add 10 min later elbow flexion - and then back of on that and increase pronation another 10 min  (done because of increase numbness when flexion added) Followed by pronation wheel with 1 lbs weight 1 lbs for elbow flexion behind head , and then standing against wall  12 reps  Each  3 sessions for elbow ext and supination 30 min each   Followed by supination wheel with 1 lbs weight And YTB in standing against wall - 12 reps each  Added some xtra padding on ulnar side of forearm - to decrease numbness in ulnar side of hand - and modify strap around hand to decrease pressure on 5th digit

## 2016-07-23 ENCOUNTER — Ambulatory Visit: Payer: PPO | Admitting: Occupational Therapy

## 2016-07-23 DIAGNOSIS — R202 Paresthesia of skin: Secondary | ICD-10-CM

## 2016-07-23 DIAGNOSIS — R200 Anesthesia of skin: Secondary | ICD-10-CM

## 2016-07-23 DIAGNOSIS — M25621 Stiffness of right elbow, not elsewhere classified: Secondary | ICD-10-CM

## 2016-07-23 DIAGNOSIS — M25631 Stiffness of right wrist, not elsewhere classified: Secondary | ICD-10-CM

## 2016-07-23 DIAGNOSIS — M6281 Muscle weakness (generalized): Secondary | ICD-10-CM

## 2016-07-23 DIAGNOSIS — M25641 Stiffness of right hand, not elsewhere classified: Secondary | ICD-10-CM

## 2016-07-23 NOTE — Therapy (Signed)
[place] [facility] [address] [place], [address] Phone: [phone]   Fax:  [phone]  Occupational Therapy Treatment  Patient Details  Name: Megan Hodge MRN: [id number] Date of Birth: 1946-03-17 Referring Provider: Celesta Aver  Encounter Date: 07/23/2016      OT End of Session - 07/23/16 1216    Visit Number 18   Number of Visits 24   Date for OT Re-Evaluation 08/13/16   OT Start Time 1200   OT Stop Time 1259   OT Time Calculation (min) 59 min   Activity Tolerance Patient tolerated treatment well   Behavior During Therapy [facility] for tasks assessed/performed      Past Medical History:  Diagnosis Date  . Abnormal mammogram, unspecified    right breast  . Cancer ([place]) 1968   cervical cancer  . Hypertension 1995    Past Surgical History:  Procedure Laterality Date  . ABDOMINAL HYSTERECTOMY  1993  . APPENDECTOMY  1993  . BREAST BIOPSY Right 1992  . BREAST BIOPSY Right March 2014   Atypical ductal hyperplasia. Failed trial of tamoxifen for chemoprevention.  . COLONOSCOPY  08/2014   Dr. Vira Agar  . DILATION AND CURETTAGE OF UTERUS  1968    There were no vitals filed for this visit.      Subjective Assessment - 07/23/16 1214    Subjective  I used it - the numbness not as bad in my pinkie or ring finger - mostly when using splint and doing the turning palm up - it is just not comfortable    Patient Stated Goals Want to use my R dominant hand like prior to ROM - brushing teeth, eating , cutting food, phone , fixing hair,  bathing , dressing    Currently in Pain? No/denies                      OT Treatments/Exercises (OP) - 07/23/16 0001      RUE Paraffin   Number Minutes Paraffin 10 Minutes   Comments AT [facility] to hand to elbow - to increase ROM - heatinpad over to increase elbow extention - pt appear to have more extention starting out this date       Paraffin done to elbow and hand  - large heatingpad over elbow for extention stretch- need pillow under hand and rolled up towel under upper arm - to decrease compression on elbow- but appear to be able to extend arm better  PRolonged stretch with gentle traction for elbow extention - 3 x 45 sec with flexion glove on hand   flexion glove  With wrist support - 2 x 1 min with  with intrinsic stretch 1 min in between  AROM intrinsic fist block - done 2 x during glove and intrinsic stretch  PROM composite flexion 5 reps each digit  AROM to 4 cm and 2 cm foam block  AAROM to 3cm and 2 cm foam block in palm  inbetween   PROM and AAROM for wrist extention  Supination and pronation done  10 reps hold 5 sec    Lantz splint on for  elbow extention with supination  10 min followed by YTB for elbow extention 10 reps  Less numbness in 4th and 5th digit            OT Education - 07/23/16 1215    Education provided Yes   Education Details splint use and HEP    Person(s)  Educated Patient   Methods Explanation;Demonstration;Verbal cues;Tactile cues   Comprehension Verbalized understanding;Returned demonstration;Verbal cues required          OT Short Term Goals - 07/16/16 1553      OT SHORT TERM GOAL #1   Title R hand digits flexion able to touch palm to hold toothbrush and  utencils   Baseline PRogressing - see flowsheet   Time 3   Period Weeks   Status On-going     OT SHORT TERM GOAL #2   Title Pt independing in HEP to decrease scar tissue, increase  digits and wrist AROM to turn doorknob, do puzzles, wash dishes   Baseline progressing in HEP - but still needed educations   Time 3   Period Weeks   Status On-going     OT SHORT TERM GOAL #3   Title R wrist sup/pro and wrist extention improve with more than30 degrees to turn doorknob and pages, use sweeper duster   Baseline wrist ext, sup and pronation still impaired   Time 3   Period Weeks   Status On-going           OT Long Term Goals - 07/16/16  1553      OT LONG TERM GOAL #1   Title R elbow extention improve with more than 30 degrees  to  tie shoes and reach in pocket   Baseline still impaired - 40 walk in ext, PROM -30 this date   Time 4   Period Weeks   Status On-going     OT LONG TERM GOAL #2   Title R elbow flexion improve with with more than 20 degrees to do hair, apply deodorant, do jewelry    Baseline R elbow this date 120    Time 4   Period Weeks   Status On-going     OT LONG TERM GOAL #3   Title R grip strength improve for pt to have more than 50% grip compare to L hand    Baseline NT    Time 4   Period Weeks   Status On-going               Plan - 07/23/16 1216    Clinical Impression Statement Pt report having less numbness in Lantz splint during use - still some with supination - pt do appear to have some improvement in extention of elbow - PPROM -32 and AROM -38.  Encourage pt to use splint as suppose to  - and to cont 2 x wk in clinic to focus on hand and wrist ROM    Rehab Potential Good   OT Frequency 3x / week   OT Duration 4 weeks   OT Treatment/Interventions Self-care/ADL training;Moist Heat;Fluidtherapy;Splinting;Patient/family education;Therapeutic exercises;Contrast Bath;Ultrasound;Scar mobilization;Passive range of motion;Parrafin;Manual Therapy   Plan increase ROM at wrist and hand    OT Home Exercise Plan see pt instruction    Consulted and Agree with Plan of Care Patient      Patient will benefit from skilled therapeutic intervention in order to improve the following deficits and impairments:  Decreased range of motion, Impaired flexibility, Increased edema, Decreased knowledge of precautions, Impaired UE functional use, Pain, Decreased strength  Visit Diagnosis: Stiffness of right elbow, not elsewhere classified  Stiffness of right hand, not elsewhere classified  Stiffness of right wrist, not elsewhere classified  Numbness and tingling of hand  Muscle weakness  (generalized)    Problem List Patient Active Problem List   Diagnosis Date Noted  .  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 Atypical ductal hyperplasia, breast 09/26/2012    Class: Diagnosis of    Rosalyn Gess OTR/L,ClT 07/23/2016, 2:09 PM  [place] [facility] [address], [place], [address] Phone: [phone]   Fax:  [phone]  Name: Megan Hodge MRN: [id number] Date of Birth: 03-06-1946

## 2016-07-23 NOTE — Patient Instructions (Signed)
Pt to cont with Lanzt splint  And focus on intrinsic fist stretch inbetween flexion glove  And AROM to foam block

## 2016-07-26 ENCOUNTER — Ambulatory Visit: Payer: PPO | Admitting: Occupational Therapy

## 2016-07-26 DIAGNOSIS — M25631 Stiffness of right wrist, not elsewhere classified: Secondary | ICD-10-CM

## 2016-07-26 DIAGNOSIS — M6281 Muscle weakness (generalized): Secondary | ICD-10-CM

## 2016-07-26 DIAGNOSIS — M25641 Stiffness of right hand, not elsewhere classified: Secondary | ICD-10-CM

## 2016-07-26 DIAGNOSIS — R202 Paresthesia of skin: Secondary | ICD-10-CM

## 2016-07-26 DIAGNOSIS — R200 Anesthesia of skin: Secondary | ICD-10-CM

## 2016-07-26 DIAGNOSIS — M25621 Stiffness of right elbow, not elsewhere classified: Secondary | ICD-10-CM | POA: Diagnosis not present

## 2016-07-26 NOTE — Patient Instructions (Addendum)
Same HEP  

## 2016-07-26 NOTE — Therapy (Signed)
[place] [facility] [address] [place], [address] Phone: [phone]   Fax:  [phone]  Occupational Therapy Treatment  Patient Details  Name: Megan Hodge MRN: [id number] Date of Birth: 05/31/1946 Referring Provider: Celesta Aver  Encounter Date: 07/26/2016      OT End of Session - 07/26/16 1133    Visit Number 19   Number of Visits 24   Date for OT Re-Evaluation 08/13/16   OT Start Time 1115   OT Stop Time 1216   OT Time Calculation (min) 61 min   Activity Tolerance Patient tolerated treatment well   Behavior During Therapy [facility] for tasks assessed/performed      Past Medical History:  Diagnosis Date  . Abnormal mammogram, unspecified    right breast  . Cancer ([place]) 1968   cervical cancer  . Hypertension 1995    Past Surgical History:  Procedure Laterality Date  . ABDOMINAL HYSTERECTOMY  1993  . APPENDECTOMY  1993  . BREAST BIOPSY Right 1992  . BREAST BIOPSY Right March 2014   Atypical ductal hyperplasia. Failed trial of tamoxifen for chemoprevention.  . COLONOSCOPY  08/2014   Dr. Vira Agar  . DILATION AND CURETTAGE OF UTERUS  1968    There were no vitals filed for this visit.      Subjective Assessment - 07/26/16 1126    Subjective  My numbness is better in my hand - splint doing okay - and fingers are not going numb in elbow splint anymore -reaching more automatic with hand - trying to eat with my hand , reaching with it , drying dishes and helping pulling up socks    Patient Stated Goals Want to use my R dominant hand like prior to ROM - brushing teeth, eating , cutting food, phone , fixing hair,  bathing , dressing    Currently in Pain? No/denies                      OT Treatments/Exercises (OP) - 07/26/16 0001      Moist Heat Therapy   Number Minutes Moist Heat 10 Minutes   Moist Heat Location Elbow     RUE Paraffin   Number Minutes Paraffin 10 Minutes   Comments at  [facility] to increase ROM in digits felxion - did coban flexion stretch on digits - and elbow extention     Paraffin done to hand and wrist- large heatingpad over elbow for extention stretch-coban flexion strap to each digit - while in paraffin and heating pad   flexion glove With wrist support - 2  min with with intrinsic stretch 1 min afterwards PROM composite flexion until pull Flexion glove again 2 min AROM intrinsic fist block   PROM composite flexion 5 reps each digit  AROM to 4 cm and 2 cm foam block  AAROM  2 cm foam block place and hold  Graston tools to volar wrist nr 2 sweeping and scooping  - with gentle PROM for wrist extention    BTE for sup and pronation CPM 200 sec each BTE 0lbs for pronation , AAROM end range - but not full range 120 sec Needed min A to not compensate with wrist flexion and leaning with body Gripper 18 lbs on BTE 120 sec  CPM for wrist extention 100 sec  AROM and place and hold wrist ext 10 reps  1 lbs wrist extention 10 reps - place and hold  Able to see some wrinkles  this date on dorsal wrist             OT Education - 07/26/16 1133    Education Details HEP and changes    Person(s) Educated Patient   Methods Explanation;Demonstration;Tactile cues;Verbal cues   Comprehension Verbal cues required;Returned demonstration;Verbalized understanding          OT Short Term Goals - 07/16/16 1553      OT SHORT TERM GOAL #1   Title R hand digits flexion able to touch palm to hold toothbrush and  utencils   Baseline PRogressing - see flowsheet   Time 3   Period Weeks   Status On-going     OT SHORT TERM GOAL #2   Title Pt independing in HEP to decrease scar tissue, increase  digits and wrist AROM to turn doorknob, do puzzles, wash dishes   Baseline progressing in HEP - but still needed educations   Time 3   Period Weeks   Status On-going     OT SHORT TERM GOAL #3   Title R wrist sup/pro and wrist extention improve with more than30  degrees to turn doorknob and pages, use sweeper duster   Baseline wrist ext, sup and pronation still impaired   Time 3   Period Weeks   Status On-going           OT Long Term Goals - 07/16/16 1553      OT LONG TERM GOAL #1   Title R elbow extention improve with more than 30 degrees  to  tie shoes and reach in pocket   Baseline still impaired - 40 walk in ext, PROM -30 this date   Time 4   Period Weeks   Status On-going     OT LONG TERM GOAL #2   Title R elbow flexion improve with with more than 20 degrees to do hair, apply deodorant, do jewelry    Baseline R elbow this date 120    Time 4   Period Weeks   Status On-going     OT LONG TERM GOAL #3   Title R grip strength improve for pt to have more than 50% grip compare to L hand    Baseline NT    Time 4   Period Weeks   Status On-going               Plan - 07/26/16 1134    Clinical Impression Statement Pt do report increase use of R hand in ADL's - and decrease numbness in 4thand 5th during day - pt still extremely tight in wrist ext, flexion of digits and elbow - cont with LANTZ splint and HEP    Rehab Potential Good   OT Frequency 3x / week   OT Duration 4 weeks   OT Treatment/Interventions Self-care/ADL training;Moist Heat;Fluidtherapy;Splinting;Patient/family education;Therapeutic exercises;Contrast Bath;Ultrasound;Scar mobilization;Passive range of motion;Parrafin;Manual Therapy   Plan focus on digits , wrist , elbow - ROM    OT Home Exercise Plan see pt instruction    Consulted and Agree with Plan of Care Patient      Patient will benefit from skilled therapeutic intervention in order to improve the following deficits and impairments:  Decreased range of motion, Impaired flexibility, Increased edema, Decreased knowledge of precautions, Impaired UE functional use, Pain, Decreased strength  Visit Diagnosis: Stiffness of right elbow, not elsewhere classified  Stiffness of right hand, not elsewhere  classified  Stiffness of right wrist, not elsewhere classified  Numbness and tingling of hand  Muscle weakness (  generalized)    Problem List Patient Active Problem List   Diagnosis Date Noted  . 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 Atypical ductal hyperplasia, breast 09/26/2012    Class: Diagnosis of    Rosalyn Gess OTR/L,CLT 07/26/2016, 12:52 PM  [place] [facility] [address], [place], [address] Phone: [phone]   Fax:  [phone]  Name: MILLIANA MARINAS MRN: [id number] Date of Birth: Sep 22, 1945

## 2016-07-29 ENCOUNTER — Ambulatory Visit: Payer: PPO | Admitting: Occupational Therapy

## 2016-08-03 ENCOUNTER — Ambulatory Visit: Payer: PPO | Admitting: Occupational Therapy

## 2016-08-03 DIAGNOSIS — R200 Anesthesia of skin: Secondary | ICD-10-CM

## 2016-08-03 DIAGNOSIS — R202 Paresthesia of skin: Secondary | ICD-10-CM

## 2016-08-03 DIAGNOSIS — M6281 Muscle weakness (generalized): Secondary | ICD-10-CM

## 2016-08-03 DIAGNOSIS — M25621 Stiffness of right elbow, not elsewhere classified: Secondary | ICD-10-CM

## 2016-08-03 DIAGNOSIS — M25641 Stiffness of right hand, not elsewhere classified: Secondary | ICD-10-CM

## 2016-08-03 DIAGNOSIS — M25631 Stiffness of right wrist, not elsewhere classified: Secondary | ICD-10-CM

## 2016-08-03 NOTE — Patient Instructions (Signed)
Same HEP  But Lantz splint applied and done pronation - pt to put foam piece at base of thumb  And then supination - need to back of on elbow extention last 10 min -appear splint shifts distally - causing pain at [facility] of thumb - able to increase stretch to 80 degrees in [place] for supination

## 2016-08-03 NOTE — Therapy (Signed)
[place] [facility] [address], [place], [address] Phone: [phone]   Fax:  [phone]  Occupational Therapy Treatment  Patient Details  Name: Megan Hodge MRN: [id number] Date of Birth: 10-Oct-1945 Referring Provider: Celesta Aver  Encounter Date: 08/03/2016      OT End of Session - 08/03/16 2044    Visit Number 20   Number of Visits 24   Date for OT Re-Evaluation 08/13/16   OT Start Time 1216   OT Stop Time 1311   OT Time Calculation (min) 55 min   Activity Tolerance Patient tolerated treatment well   Behavior During Therapy [facility] for tasks assessed/performed      Past Medical History:  Diagnosis Date  . Abnormal mammogram, unspecified    right breast  . Cancer ([place]) 1968   cervical cancer  . Hypertension 1995    Past Surgical History:  Procedure Laterality Date  . ABDOMINAL HYSTERECTOMY  1993  . APPENDECTOMY  1993  . BREAST BIOPSY Right 1992  . BREAST BIOPSY Right March 2014   Atypical ductal hyperplasia. Failed trial of tamoxifen for chemoprevention.  . COLONOSCOPY  08/2014   Dr. Vira Agar  . DILATION AND CURETTAGE OF UTERUS  1968    There were no vitals filed for this visit.      Subjective Assessment - 08/03/16 2037    Subjective  Doing okay with splint - but I don't know if I am feeling it a the right sport with rotatin of palm up and down - but I am using my fingers more - girpping more    Patient Stated Goals Want to use my R dominant hand like prior to ROM - brushing teeth, eating , cutting food, phone , fixing hair,  bathing , dressing    Currently in Pain? No/denies            [facility] OT Assessment - 08/03/16 0001      AROM   Right Elbow Flexion 130   Right Elbow Extension -45   Right Forearm Pronation 55 Degrees   Right Forearm Supination 60 Degrees     Right Hand AROM   R Index  MCP 0-90 70 Degrees   R Index PIP 0-100 85 Degrees   R Long  MCP 0-90 80 Degrees   R Long PIP  0-100 84 Degrees   R Ring  MCP 0-90 75 Degrees   R Ring PIP 0-100 80 Degrees   R Little  MCP 0-90 70 Degrees   R Little PIP 0-100 85 Degrees                  OT Treatments/Exercises (OP) - 08/03/16 0001      RUE Paraffin   Number Minutes Paraffin 10 Minutes   Comments AT [facility] to elbow and hand - with coban flexion wrap to all digits to increase ROM - flexion in digits       Paraffin done to hand and elbow -coban flexion strap to each digit - while in paraffin and heating pad   flexion glove With wrist support - 2  min with with intrinsic stretch 1 min afterwards PROM composite flexion until pull To each digit Lantz splint applied and done pronation - pt to put foam piece at base of thumb  And then supination - need to back of on elbow extention last 10 min -appear splint shifts distally - causing pain at [facility] of thumb - able to increase stretch to  80 degrees in [place] for supination     BTE 0lbs for pronation  And supination 120 sec each - but not full range 120 sec Needed min A to not compensate with wrist flexion and leaning with body Gripper 18 lbs on BTE 120 sec              OT Education - 08/03/16 2044    Education provided Yes   Education Details Changes to use of LANTZ splint for sup /pro - and extention ofelbow    Person(s) Educated Patient   Methods Explanation;Demonstration;Tactile cues;Verbal cues   Comprehension Verbal cues required;Returned demonstration;Verbalized understanding          OT Short Term Goals - 07/16/16 1553      OT SHORT TERM GOAL #1   Title R hand digits flexion able to touch palm to hold toothbrush and  utencils   Baseline PRogressing - see flowsheet   Time 3   Period Weeks   Status On-going     OT SHORT TERM GOAL #2   Title Pt independing in HEP to decrease scar tissue, increase  digits and wrist AROM to turn doorknob, do puzzles, wash dishes   Baseline progressing in HEP - but still needed educations   Time 3    Period Weeks   Status On-going     OT SHORT TERM GOAL #3   Title R wrist sup/pro and wrist extention improve with more than30 degrees to turn doorknob and pages, use sweeper duster   Baseline wrist ext, sup and pronation still impaired   Time 3   Period Weeks   Status On-going           OT Long Term Goals - 07/16/16 1553      OT LONG TERM GOAL #1   Title R elbow extention improve with more than 30 degrees  to  tie shoes and reach in pocket   Baseline still impaired - 40 walk in ext, PROM -30 this date   Time 4   Period Weeks   Status On-going     OT LONG TERM GOAL #2   Title R elbow flexion improve with with more than 20 degrees to do hair, apply deodorant, do jewelry    Baseline R elbow this date 120    Time 4   Period Weeks   Status On-going     OT LONG TERM GOAL #3   Title R grip strength improve for pt to have more than 50% grip compare to L hand    Baseline NT    Time 4   Period Weeks   Status On-going               Plan - 08/03/16 2045    Clinical Impression Statement Pt show increase digits flexion - and after PROM of showed increase supination and pronation - did had to reassess fit and sequence for using LANTZ for sup /pro   Rehab Potential Good   OT Frequency 2x / week   OT Duration 4 weeks   OT Treatment/Interventions Self-care/ADL training;Moist Heat;Fluidtherapy;Splinting;Patient/family education;Therapeutic exercises;Contrast Bath;Ultrasound;Scar mobilization;Passive range of motion;Parrafin;Manual Therapy   Plan assess progress in sup/pro and elbow    OT Home Exercise Plan see pt instruction    Consulted and Agree with Plan of Care Patient      Patient will benefit from skilled therapeutic intervention in order to improve the following deficits and impairments:  Decreased range of motion, Impaired flexibility, Increased edema, Decreased knowledge of  precautions, Impaired UE functional use, Pain, Decreased strength  Visit  Diagnosis: Stiffness of right elbow, not elsewhere classified  Stiffness of right hand, not elsewhere classified  Stiffness of right wrist, not elsewhere classified  Numbness and tingling of hand  Muscle weakness (generalized)    Problem List Patient Active Problem List   Diagnosis Date Noted  . 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 Atypical ductal hyperplasia, breast 09/26/2012    Class: Diagnosis of    Rosalyn Gess OTR/L,CLT 08/03/2016, 8:47 PM  [place] [facility] [address] [place], [address] Phone: [phone]   Fax:  [phone]  Name: Megan Hodge MRN: [id number] Date of Birth: [birth year]

## 2016-08-04 DIAGNOSIS — I10 Essential (primary) hypertension: Secondary | ICD-10-CM | POA: Diagnosis not present

## 2016-08-04 DIAGNOSIS — N183 Chronic kidney disease, stage 3 (moderate): Secondary | ICD-10-CM | POA: Diagnosis not present

## 2016-08-04 DIAGNOSIS — R739 Hyperglycemia, unspecified: Secondary | ICD-10-CM | POA: Diagnosis not present

## 2016-08-04 DIAGNOSIS — E782 Mixed hyperlipidemia: Secondary | ICD-10-CM | POA: Diagnosis not present

## 2016-08-05 ENCOUNTER — Encounter: Payer: PPO | Admitting: Occupational Therapy

## 2016-08-05 ENCOUNTER — Ambulatory Visit: Payer: PPO | Admitting: Occupational Therapy

## 2016-08-05 DIAGNOSIS — M25621 Stiffness of right elbow, not elsewhere classified: Secondary | ICD-10-CM | POA: Diagnosis not present

## 2016-08-05 DIAGNOSIS — M6281 Muscle weakness (generalized): Secondary | ICD-10-CM

## 2016-08-05 DIAGNOSIS — R200 Anesthesia of skin: Secondary | ICD-10-CM

## 2016-08-05 DIAGNOSIS — M25631 Stiffness of right wrist, not elsewhere classified: Secondary | ICD-10-CM

## 2016-08-05 DIAGNOSIS — R202 Paresthesia of skin: Secondary | ICD-10-CM

## 2016-08-05 DIAGNOSIS — M25641 Stiffness of right hand, not elsewhere classified: Secondary | ICD-10-CM

## 2016-08-05 NOTE — Patient Instructions (Signed)
Pt to do 16 hammer for grip with sup/pro for wrist At home after [facility]  Splint  Use

## 2016-08-05 NOTE — Therapy (Signed)
[place] [facility] [address], [place], [address] Phone: [phone]   Fax:  [phone]  Occupational Therapy Treatment  Patient Details  Name: Megan Hodge MRN: [id number] Date of Birth: Oct 27, 1945 Referring Provider: Celesta Aver  Encounter Date: 08/05/2016      OT End of Session - 08/05/16 1233    Visit Number 21   Number of Visits 24   Date for OT Re-Evaluation 08/13/16   OT Start Time 1115   OT Stop Time 1204   OT Time Calculation (min) 49 min   Activity Tolerance Patient tolerated treatment well   Behavior During Therapy [facility] for tasks assessed/performed      Past Medical History:  Diagnosis Date  . Abnormal mammogram, unspecified    right breast  . Cancer ([place]) 1968   cervical cancer  . Hypertension 1995    Past Surgical History:  Procedure Laterality Date  . ABDOMINAL HYSTERECTOMY  1993  . APPENDECTOMY  1993  . BREAST BIOPSY Right 1992  . BREAST BIOPSY Right March 2014   Atypical ductal hyperplasia. Failed trial of tamoxifen for chemoprevention.  . COLONOSCOPY  08/2014   Dr. Vira Agar  . DILATION AND CURETTAGE OF UTERUS  1968    There were no vitals filed for this visit.      Subjective Assessment - 08/05/16 1221    Subjective  Doing okay -working better with the elbow splint  -doing less extention and more wrist rotation - maybe will do 20 of each - and do longer - I feel like my fingers are getting better    Patient Stated Goals Want to use my R dominant hand like prior to ROM - brushing teeth, eating , cutting food, phone , fixing hair,  bathing , dressing    Currently in Pain? No/denies                      OT Treatments/Exercises (OP) - 08/05/16 0001      RUE Paraffin   Number Minutes Paraffin 10 Minutes   Comments at [facility] to hand and elbow - with flexion coban strap to digits and pronation stretch       Paraffin done to hand and elbow -coban flexion strap to each  digit - while in paraffin and heating pad - held in pronation stretch  flexion glove With wrist support - 2 min with with intrinsic stretch 1 min afterwards PROM composite flexion until pull To each digit Place and hold   Graston tools nr 2 for sweeping to volar forearm to increase wrist AROM and digits flexion - tight at volar wrist   BTE for gripper at 18 lbs , then 20 lbs - 120 sec each  Lat grip and 3 point grip 10 lbs each - 120 sec   1 kg ball palm to palm - L <>R hand - with AAROM for sup/pro  2 min  BTE CPM for sup/pronation - 200 sec each   BTE 0lbs for pronation  And supination 120 sec each - 120 sec each  16 hammer for sup/pro done 10 reps   Add to HEP               OT Education - 08/05/16 1233    Education provided Yes   Education Details HEP - add sup/pro exercise   Person(s) Educated Patient   Methods Explanation;Demonstration;Tactile cues;Verbal cues   Comprehension Verbal cues required;Returned demonstration;Verbalized understanding  OT Short Term Goals - 07/16/16 1553      OT SHORT TERM GOAL #1   Title R hand digits flexion able to touch palm to hold toothbrush and  utencils   Baseline PRogressing - see flowsheet   Time 3   Period Weeks   Status On-going     OT SHORT TERM GOAL #2   Title Pt independing in HEP to decrease scar tissue, increase  digits and wrist AROM to turn doorknob, do puzzles, wash dishes   Baseline progressing in HEP - but still needed educations   Time 3   Period Weeks   Status On-going     OT SHORT TERM GOAL #3   Title R wrist sup/pro and wrist extention improve with more than30 degrees to turn doorknob and pages, use sweeper duster   Baseline wrist ext, sup and pronation still impaired   Time 3   Period Weeks   Status On-going           OT Long Term Goals - 07/16/16 1553      OT LONG TERM GOAL #1   Title R elbow extention improve with more than 30 degrees  to  tie shoes and reach in  pocket   Baseline still impaired - 40 walk in ext, PROM -30 this date   Time 4   Period Weeks   Status On-going     OT LONG TERM GOAL #2   Title R elbow flexion improve with with more than 20 degrees to do hair, apply deodorant, do jewelry    Baseline R elbow this date 120    Time 4   Period Weeks   Status On-going     OT LONG TERM GOAL #3   Title R grip strength improve for pt to have more than 50% grip compare to L hand    Baseline NT    Time 4   Period Weeks   Status On-going               Plan - 08/05/16 1234    Clinical Impression Statement Pt show increase digits flexion - and during session increase pronation and supination - this date able to intiated hammer to HEP for grip and sup/pro - cont to work [employer] Good   OT Frequency 2x / week   OT Duration 4 weeks   OT Treatment/Interventions Self-care/ADL training;Moist Heat;Fluidtherapy;Splinting;Patient/family education;Therapeutic exercises;Contrast Bath;Ultrasound;Scar mobilization;Passive range of motion;Parrafin;Manual Therapy   Plan assess progress  in ROM    OT Home Exercise Plan see pt instruction    Consulted and Agree with Plan of Care Patient      Patient will benefit from skilled therapeutic intervention in order to improve the following deficits and impairments:  Decreased range of motion, Impaired flexibility, Increased edema, Decreased knowledge of precautions, Impaired UE functional use, Pain, Decreased strength  Visit Diagnosis: Stiffness of right elbow, not elsewhere classified  Stiffness of right hand, not elsewhere classified  Stiffness of right wrist, not elsewhere classified  Numbness and tingling of hand  Muscle weakness (generalized)    Problem List Patient Active Problem List   Diagnosis Date Noted  . 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 Atypical ductal hyperplasia, breast 09/26/2012    Class: Diagnosis of    Rosalyn Gess  OTR/L,CLT 08/05/2016, 12:36 PM  [place] [facility] [address], [place], [address] Phone: [phone]   Fax:  [phone]  Name: IEASHIA BOEGEL MRN: [id number] Date of Birth: 1945/11/14

## 2016-08-10 ENCOUNTER — Ambulatory Visit: Payer: PPO | Admitting: Occupational Therapy

## 2016-08-10 DIAGNOSIS — R200 Anesthesia of skin: Secondary | ICD-10-CM

## 2016-08-10 DIAGNOSIS — M25621 Stiffness of right elbow, not elsewhere classified: Secondary | ICD-10-CM

## 2016-08-10 DIAGNOSIS — M25641 Stiffness of right hand, not elsewhere classified: Secondary | ICD-10-CM

## 2016-08-10 DIAGNOSIS — M25631 Stiffness of right wrist, not elsewhere classified: Secondary | ICD-10-CM

## 2016-08-10 DIAGNOSIS — R202 Paresthesia of skin: Secondary | ICD-10-CM

## 2016-08-10 NOTE — Therapy (Signed)
[place] [facility] [address], [place], [address] Phone: [phone]   Fax:  [phone]  Occupational Therapy Treatment  Patient Details  Name: Megan Hodge MRN: [id number] Date of Birth: [birth year] Referring Provider: Celesta Aver  Encounter Date: 08/10/2016      OT End of Session - 08/10/16 1238    Visit Number 22   Number of Visits 24   Date for OT Re-Evaluation 08/13/16   OT Start Time 1116   OT Stop Time 1207   OT Time Calculation (min) 51 min   Activity Tolerance Patient tolerated treatment well   Behavior During Therapy [facility] for tasks assessed/performed      Past Medical History:  Diagnosis Date  . Abnormal mammogram, unspecified    right breast  . Cancer ([place]) 1968   cervical cancer  . Hypertension 1995    Past Surgical History:  Procedure Laterality Date  . ABDOMINAL HYSTERECTOMY  1993  . APPENDECTOMY  1993  . BREAST BIOPSY Right 1992  . BREAST BIOPSY Right March 2014   Atypical ductal hyperplasia. Failed trial of tamoxifen for chemoprevention.  . COLONOSCOPY  08/2014   Dr. Vira Agar  . DILATION AND CURETTAGE OF UTERUS  1968    There were no vitals filed for this visit.      Subjective Assessment - 08/10/16 1138    Subjective  Using it more - my hand still get tight during day but not as often - do you have new compression glove - straightening elbow slowest - numbness comes goes - less than it was   Patient Stated Goals Want to use my R dominant hand like prior to ROM - brushing teeth, eating , cutting food, phone , fixing hair,  bathing , dressing    Currently in Pain? No/denies                      OT Treatments/Exercises (OP) - 08/10/16 0001      Moist Heat Therapy   Number Minutes Moist Heat 10 Minutes   Moist Heat Location Elbow     RUE Paraffin   Number Minutes Paraffin 10 Minutes   Comments at [facility] to increase elbow ext, digits flexion and sup/pro       Elbow extention stretch using large heatingpad to increase extention stretch - while paraffin bath to elbow, hand and wrist   LANTZ ESP splint applied for elbow extention - increase 3x in 14 min  Extention increase from -40 to -35 AROM  PROM to -28 at end   After extention splint wore - YTB for elbow extention - need mod v/c to be against wall and block shoulder exention and compensation  PROM done for sup and pronaton separate and then both AAROM from sup<> pronation   10 reps each  Followed by 16 oz hammer for sup/pro 10 reps - need min A  10 reps  Applied flexion glove - adjusted for pt to feel stretch - pt to do composite flexion - done in clinic  Place and hold to 2 cm foam block in palm  10 reps each  BTE gripper for 120 sec at 20 lbs             OT Education - 08/10/16 1238    Education provided Yes   Education Details HEP and splint use - composite flexion PROM    Person(s) Educated Patient   Methods Explanation;Demonstration;Tactile cues;Verbal cues   Comprehension Verbalized  understanding;Returned demonstration;Verbal cues required          OT Short Term Goals - 07/16/16 1553      OT SHORT TERM GOAL #1   Title R hand digits flexion able to touch palm to hold toothbrush and  utencils   Baseline PRogressing - see flowsheet   Time 3   Period Weeks   Status On-going     OT SHORT TERM GOAL #2   Title Pt independing in HEP to decrease scar tissue, increase  digits and wrist AROM to turn doorknob, do puzzles, wash dishes   Baseline progressing in HEP - but still needed educations   Time 3   Period Weeks   Status On-going     OT SHORT TERM GOAL #3   Title R wrist sup/pro and wrist extention improve with more than30 degrees to turn doorknob and pages, use sweeper duster   Baseline wrist ext, sup and pronation still impaired   Time 3   Period Weeks   Status On-going           OT Long Term Goals - 07/16/16 1553      OT LONG TERM GOAL #1   Title R  elbow extention improve with more than 30 degrees  to  tie shoes and reach in pocket   Baseline still impaired - 40 walk in ext, PROM -30 this date   Time 4   Period Weeks   Status On-going     OT LONG TERM GOAL #2   Title R elbow flexion improve with with more than 20 degrees to do hair, apply deodorant, do jewelry    Baseline R elbow this date 120    Time 4   Period Weeks   Status On-going     OT LONG TERM GOAL #3   Title R grip strength improve for pt to have more than 50% grip compare to L hand    Baseline NT    Time 4   Period Weeks   Status On-going               Plan - 08/10/16 1239    Clinical Impression Statement Pt show increase AROM after LANTZ splint use for AROM -35 and PROM -28 - sup and pronation still impaired but slowly progressing - as well as flexion of digits - need to work on  composite flexion - wrist extention still impaired - pt report increase pain when address on BTE with CPM    Rehab Potential Good   OT Frequency 2x / week   OT Duration 4 weeks   OT Treatment/Interventions Self-care/ADL training;Moist Heat;Fluidtherapy;Splinting;Patient/family education;Therapeutic exercises;Contrast Bath;Ultrasound;Scar mobilization;Passive range of motion;Parrafin;Manual Therapy   Plan Results from MD visit   OT Home Exercise Plan see pt instruction    Consulted and Agree with Plan of Care Patient      Patient will benefit from skilled therapeutic intervention in order to improve the following deficits and impairments:  Decreased range of motion, Impaired flexibility, Increased edema, Decreased knowledge of precautions, Impaired UE functional use, Pain, Decreased strength  Visit Diagnosis: Stiffness of right elbow, not elsewhere classified  Stiffness of right hand, not elsewhere classified  Stiffness of right wrist, not elsewhere classified  Numbness and tingling of hand    Problem List Patient Active Problem List   Diagnosis Date Noted  . Atypical  ductal hyperplasia of breast 09/25/2014  . Atypical ductal hyperplasia, breast 09/26/2012    Class: Diagnosis of    Aastha Dayley, Gwenette Greet  OTR/L,CLT 08/10/2016, 12:41 PM  [place] [facility] [address], [place], [address] Phone: [phone]   Fax:  [phone]  Name: Megan Hodge MRN: [id number] Date of Birth: 07-22-1945

## 2016-08-10 NOTE — Patient Instructions (Signed)
Reinforce importance for strengthening after splints Use of flexion glove and add composite flexion PROM

## 2016-08-11 DIAGNOSIS — S52101D Unspecified fracture of upper end of right radius, subsequent encounter for closed fracture with routine healing: Secondary | ICD-10-CM | POA: Diagnosis not present

## 2016-08-11 DIAGNOSIS — S52001D Unspecified fracture of upper end of right ulna, subsequent encounter for closed fracture with routine healing: Secondary | ICD-10-CM | POA: Diagnosis not present

## 2016-08-11 DIAGNOSIS — Y33XXXD Other specified events, undetermined intent, subsequent encounter: Secondary | ICD-10-CM | POA: Diagnosis not present

## 2016-08-12 ENCOUNTER — Ambulatory Visit: Payer: PPO | Attending: Orthopedic Surgery | Admitting: Occupational Therapy

## 2016-08-12 DIAGNOSIS — M25641 Stiffness of right hand, not elsewhere classified: Secondary | ICD-10-CM | POA: Diagnosis not present

## 2016-08-12 DIAGNOSIS — R202 Paresthesia of skin: Secondary | ICD-10-CM | POA: Diagnosis not present

## 2016-08-12 DIAGNOSIS — M25621 Stiffness of right elbow, not elsewhere classified: Secondary | ICD-10-CM | POA: Diagnosis not present

## 2016-08-12 DIAGNOSIS — M6281 Muscle weakness (generalized): Secondary | ICD-10-CM | POA: Diagnosis not present

## 2016-08-12 DIAGNOSIS — M25631 Stiffness of right wrist, not elsewhere classified: Secondary | ICD-10-CM | POA: Diagnosis not present

## 2016-08-12 DIAGNOSIS — R200 Anesthesia of skin: Secondary | ICD-10-CM | POA: Diagnosis not present

## 2016-08-12 NOTE — Therapy (Signed)
[place] [facility] [address] [place], [address] Phone: [phone]   Fax:  [phone]  Occupational Therapy Treatment  Patient Details  Name: Megan Hodge MRN: [id number] Date of Birth: 08-Jan-1946 Referring Provider: Celesta Aver  Encounter Date: 08/12/2016      OT End of Session - 08/12/16 1534    Visit Number 23   Number of Visits 24   Date for OT Re-Evaluation 08/13/16   OT Start Time 1345   OT Stop Time 1458   OT Time Calculation (min) 73 min   Activity Tolerance Patient tolerated treatment well   Behavior During Therapy [facility] for tasks assessed/performed      Past Medical History:  Diagnosis Date  . Abnormal mammogram, unspecified    right breast  . Cancer ([place]) 1968   cervical cancer  . Hypertension 1995    Past Surgical History:  Procedure Laterality Date  . ABDOMINAL HYSTERECTOMY  1993  . APPENDECTOMY  1993  . BREAST BIOPSY Right 1992  . BREAST BIOPSY Right March 2014   Atypical ductal hyperplasia. Failed trial of tamoxifen for chemoprevention.  . COLONOSCOPY  08/2014   Dr. Vira Agar  . DILATION AND CURETTAGE OF UTERUS  1968    There were no vitals filed for this visit.      Subjective Assessment - 08/12/16 1529    Subjective  Seen the first Dr and then Dr Celesta Aver - they were happy - but said in beginning when I was in cast and did therapy maybe 1 x wk - my wrist got frozen - but you need to cont therapy - they send new order and  need to follow up in 2 months    Patient Stated Goals Want to use my R dominant hand like prior to ROM - brushing teeth, eating , cutting food, phone , fixing hair,  bathing , dressing    Currently in Pain? No/denies            [facility] OT Assessment - 08/12/16 0001      Right Hand AROM   R Index  MCP 0-90 70 Degrees   R Index PIP 0-100 85 Degrees   R Long  MCP 0-90 80 Degrees   R Long PIP 0-100 85 Degrees   R Ring  MCP 0-90 80 Degrees   R Ring PIP  0-100 85 Degrees   R Little  MCP 0-90 80 Degrees   R Little PIP 0-100 85 Degrees                  OT Treatments/Exercises (OP) - 08/12/16 0001      RUE Paraffin   Number Minutes Paraffin 10 Minutes   Comments at [facility] to increase ROM in digits - flexion strap with coban to increase flexion of each digit and pronation stretch       Digits flexion improving - see flowsheet   Paraffin done to hand and elbow -coban flexion strap to each digit - while in paraffin and heating pad - held in pronation stretch  PROM composite flexion of all digits  Place and hold  AROM to 2 cm foamblock  And squeezing foamblock  Fitted with MC block splint - large but pt to use over the weekend 2-3 min - with PROM for PIP while in it  10 reps each   BTE for gripper  20 lbs - 120 sec each   3 point grip 10 lbs each - 120 sec  BTE CPM wrist extention - 200 sec each - was able to do pronation to keep forearm in place   BTE 0lbs for pronation 120 sec each   Lantz ESP splint on - and review and work on elbow flexion with pronation - pt to loosen up upper arm cuffs to increase elbow flexoin - without running forearm cuff into upper arm cuff per rep  And then reviewed supination with elbow extention - pt to use some foam on ulnar side to decrease numbness in 4thand 5th                OT Education - 08/12/16 1534    Education provided Yes   Education Details HEP    Person(s) Educated Patient   Methods Explanation;Demonstration;Verbal cues;Tactile cues   Comprehension Verbalized understanding;Returned demonstration;Verbal cues required          OT Short Term Goals - 07/16/16 1553      OT SHORT TERM GOAL #1   Title R hand digits flexion able to touch palm to hold toothbrush and  utencils   Baseline PRogressing - see flowsheet   Time 3   Period Weeks   Status On-going     OT SHORT TERM GOAL #2   Title Pt independing in HEP to decrease scar tissue, increase  digits and  wrist AROM to turn doorknob, do puzzles, wash dishes   Baseline progressing in HEP - but still needed educations   Time 3   Period Weeks   Status On-going     OT SHORT TERM GOAL #3   Title R wrist sup/pro and wrist extention improve with more than30 degrees to turn doorknob and pages, use sweeper duster   Baseline wrist ext, sup and pronation still impaired   Time 3   Period Weeks   Status On-going           OT Long Term Goals - 07/16/16 1553      OT LONG TERM GOAL #1   Title R elbow extention improve with more than 30 degrees  to  tie shoes and reach in pocket   Baseline still impaired - 40 walk in ext, PROM -30 this date   Time 4   Period Weeks   Status On-going     OT LONG TERM GOAL #2   Title R elbow flexion improve with with more than 20 degrees to do hair, apply deodorant, do jewelry    Baseline R elbow this date 120    Time 4   Period Weeks   Status On-going     OT LONG TERM GOAL #3   Title R grip strength improve for pt to have more than 50% grip compare to L hand    Baseline NT    Time 4   Period Weeks   Status On-going               Plan - 08/12/16 1534    Clinical Impression Statement Pt cont to show improvement in flexion of digits , was able to get into pronation during BTE with more ease - elbow flexion - pt to focus on pronation and sup , elbow extention - add knuckle bender for digits flexion  this date    Rehab Potential Good   OT Frequency 2x / week   OT Duration 4 weeks   Plan Wrist extention check and work on - wrist extention splint custom made    OT Home Exercise Plan see pt instruction  Consulted and Agree with Plan of Care Patient      Patient will benefit from skilled therapeutic intervention in order to improve the following deficits and impairments:  Decreased range of motion, Impaired flexibility, Increased edema, Decreased knowledge of precautions, Impaired UE functional use, Pain, Decreased strength  Visit  Diagnosis: Stiffness of right elbow, not elsewhere classified  Stiffness of right hand, not elsewhere classified  Stiffness of right wrist, not elsewhere classified  Numbness and tingling of hand  Muscle weakness (generalized)    Problem List Patient Active Problem List   Diagnosis Date Noted  . 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 Atypical ductal hyperplasia, breast 09/26/2012    Class: Diagnosis of    Rosalyn Gess OTR/L,CLT 08/12/2016, 3:37 PM  [place] [facility] [address] [place], [address] Phone: [phone]   Fax:  [phone]  Name: Megan Hodge MRN: [id number] Date of Birth: Sep 21, 1945

## 2016-08-12 NOTE — Patient Instructions (Addendum)
Same HEP  - add knuckle bender to use 2-3 min instead of flexion glove -and do flexion PROM in it

## 2016-08-18 ENCOUNTER — Ambulatory Visit: Payer: PPO | Admitting: Occupational Therapy

## 2016-08-18 DIAGNOSIS — M25631 Stiffness of right wrist, not elsewhere classified: Secondary | ICD-10-CM

## 2016-08-18 DIAGNOSIS — M25641 Stiffness of right hand, not elsewhere classified: Secondary | ICD-10-CM

## 2016-08-18 DIAGNOSIS — M6281 Muscle weakness (generalized): Secondary | ICD-10-CM

## 2016-08-18 DIAGNOSIS — M25621 Stiffness of right elbow, not elsewhere classified: Secondary | ICD-10-CM | POA: Diagnosis not present

## 2016-08-18 DIAGNOSIS — R202 Paresthesia of skin: Secondary | ICD-10-CM

## 2016-08-18 DIAGNOSIS — R200 Anesthesia of skin: Secondary | ICD-10-CM

## 2016-08-18 NOTE — Therapy (Signed)
[facility] [address], [place], [address] Phone: [phone]   Fax:  [phone]  Occupational Therapy Treatment  Patient Details  Name: Megan Hodge MRN: [id number] Date of Birth: 05-10-1946 Referring Provider: Celesta Aver  Encounter Date: 08/18/2016      OT End of Session - 08/18/16 1807    Visit Number 24   Number of Visits 24   Date for OT Re-Evaluation 08/18/16   OT Start Time 1215   OT Stop Time 1300   OT Time Calculation (min) 45 min   Activity Tolerance Patient tolerated treatment well   Behavior During Therapy [facility] for tasks assessed/performed      Past Medical History:  Diagnosis Date  . Abnormal mammogram, unspecified    right breast  . Cancer ([place]) 1968   cervical cancer  . Hypertension 1995    Past Surgical History:  Procedure Laterality Date  . ABDOMINAL HYSTERECTOMY  1993  . APPENDECTOMY  1993  . BREAST BIOPSY Right 1992  . BREAST BIOPSY Right March 2014   Atypical ductal hyperplasia. Failed trial of tamoxifen for chemoprevention.  . COLONOSCOPY  08/2014   Dr. Vira Agar  . DILATION AND CURETTAGE OF UTERUS  1968    There were no vitals filed for this visit.      Subjective Assessment - 08/18/16 1804    Subjective  I am doing okay with the splint - I am using my hand more- and my fingers are some days better than othertimes - I can get the splint all the way over to turn palm over - but then without it - it stays tigth    Patient Stated Goals Want to use my R dominant hand like prior to ROM - brushing teeth, eating , cutting food, phone , fixing hair,  bathing , dressing    Currently in Pain? No/denies                      OT Treatments/Exercises (OP) - 08/18/16 0001      RUE Paraffin   Number Minutes Paraffin 10 Minutes   RUE Paraffin Location Forearm   Comments at [facility] to increase  digits flexion , sup/pro and wrist extention       Paraffin done to hand  and elbow - hand held in fist  And heating pad to increase ROM    Fabricated wrist splint to gradually increase extention of wrist - pt to wear 1-2 hrs during day - prior to wearing at night time  Ed on precautions   PROM composite flexion of all digits  Place and hold  And squeezing foamblock    BTE for gripper  20 lbs - 120 sec each    BTE CPM wrist extention - 200 sec each - and then AROM wrist extention off palm piece in tool 701 12 reps   BTE 0lbs for pronation 120 sec each             OT Education - 08/18/16 1807    Education provided Yes   Education Details splint use for elbow and new on for wrist    Person(s) Educated Patient   Methods Explanation;Demonstration;Tactile cues;Verbal cues   Comprehension Verbal cues required;Returned demonstration;Verbalized understanding          OT Short Term Goals - 07/16/16 1553      OT SHORT TERM GOAL #1   Title R hand digits flexion able to touch palm to  hold toothbrush and  utencils   Baseline PRogressing - see flowsheet   Time 3   Period Weeks   Status On-going     OT SHORT TERM GOAL #2   Title Pt independing in HEP to decrease scar tissue, increase  digits and wrist AROM to turn doorknob, do puzzles, wash dishes   Baseline progressing in HEP - but still needed educations   Time 3   Period Weeks   Status On-going     OT SHORT TERM GOAL #3   Title R wrist sup/pro and wrist extention improve with more than30 degrees to turn doorknob and pages, use sweeper duster   Baseline wrist ext, sup and pronation still impaired   Time 3   Period Weeks   Status On-going           OT Long Term Goals - 07/16/16 1553      OT LONG TERM GOAL #1   Title R elbow extention improve with more than 30 degrees  to  tie shoes and reach in pocket   Baseline still impaired - 40 walk in ext, PROM -30 this date   Time 4   Period Weeks   Status On-going     OT LONG TERM GOAL #2   Title R elbow flexion improve with with  more than 20 degrees to do hair, apply deodorant, do jewelry    Baseline R elbow this date 120    Time 4   Period Weeks   Status On-going     OT LONG TERM GOAL #3   Title R grip strength improve for pt to have more than 50% grip compare to L hand    Baseline NT    Time 4   Period Weeks   Status On-going               Plan - 08/18/16 1808    Clinical Impression Statement Pt making progress in digits flexion , supination -fabricated custom wrist splint to increase gradually extention - focus next session sup/pro and elbow extnention    Rehab Potential Good   OT Frequency 2x / week   OT Duration 4 weeks   OT Treatment/Interventions Self-care/ADL training;Moist Heat;Fluidtherapy;Splinting;Patient/family education;Therapeutic exercises;Contrast Bath;Ultrasound;Scar mobilization;Passive range of motion;Parrafin;Manual Therapy   Plan assess splint use for wrist and PREE/ROM /grip   OT Home Exercise Plan see pt instruction    Consulted and Agree with Plan of Care Patient      Patient will benefit from skilled therapeutic intervention in order to improve the following deficits and impairments:  Decreased range of motion, Impaired flexibility, Increased edema, Decreased knowledge of precautions, Impaired UE functional use, Pain, Decreased strength  Visit Diagnosis: Stiffness of right elbow, not elsewhere classified  Stiffness of right hand, not elsewhere classified  Stiffness of right wrist, not elsewhere classified  Numbness and tingling of hand  Muscle weakness (generalized)    Problem List Patient Active Problem List   Diagnosis Date Noted  . 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 Atypical ductal hyperplasia, breast 09/26/2012    Class: Diagnosis of    Rosalyn Gess OTR/L,CLT 08/18/2016, 6:18 PM  [place] [facility] [address], [place], [address] Phone: [phone]   Fax:   [phone]  Name: Megan Hodge MRN: [id number] Date of Birth: [birth year]

## 2016-08-18 NOTE — Patient Instructions (Signed)
      Fabricated wrist splint to gradually increase extention of wrist - pt to wear 1-2 hrs during day - prior to wearing at night time  Ed on precautions   same HEP

## 2016-08-20 ENCOUNTER — Ambulatory Visit: Payer: PPO | Admitting: Occupational Therapy

## 2016-08-20 DIAGNOSIS — R200 Anesthesia of skin: Secondary | ICD-10-CM

## 2016-08-20 DIAGNOSIS — M25631 Stiffness of right wrist, not elsewhere classified: Secondary | ICD-10-CM

## 2016-08-20 DIAGNOSIS — M6281 Muscle weakness (generalized): Secondary | ICD-10-CM

## 2016-08-20 DIAGNOSIS — R202 Paresthesia of skin: Secondary | ICD-10-CM

## 2016-08-20 DIAGNOSIS — M25641 Stiffness of right hand, not elsewhere classified: Secondary | ICD-10-CM

## 2016-08-20 DIAGNOSIS — M25621 Stiffness of right elbow, not elsewhere classified: Secondary | ICD-10-CM

## 2016-08-20 NOTE — Therapy (Signed)
[facility] [address], [place], [address] Phone: [phone]   Fax:  [phone]  Occupational Therapy Treatment  Patient Details  Name: Megan Hodge MRN: [id number] Date of Birth: 02-17-1946 Referring Provider: Celesta Aver  Encounter Date: 08/20/2016      OT End of Session - 08/20/16 1434    Visit Number 25   Number of Visits 33   Date for OT Re-Evaluation 09/17/16   OT Start Time 1225   OT Stop Time 1320   OT Time Calculation (min) 55 min   Activity Tolerance Patient tolerated treatment well   Behavior During Therapy [facility] for tasks assessed/performed      Past Medical History:  Diagnosis Date  . Abnormal mammogram, unspecified    right breast  . Cancer ([place]) 1968   cervical cancer  . Hypertension 1995    Past Surgical History:  Procedure Laterality Date  . ABDOMINAL HYSTERECTOMY  1993  . APPENDECTOMY  1993  . BREAST BIOPSY Right 1992  . BREAST BIOPSY Right March 2014   Atypical ductal hyperplasia. Failed trial of tamoxifen for chemoprevention.  . COLONOSCOPY  08/2014   Dr. Vira Agar  . DILATION AND CURETTAGE OF UTERUS  1968    There were no vitals filed for this visit.      Subjective Assessment - 08/20/16 1425    Subjective  I can tell my hand is getting better and that new splint you made - my wrist more straight - elbow just slow to straighten - I do not have pain really - but using my hand more    Patient Stated Goals Want to use my R dominant hand like prior to ROM - brushing teeth, eating , cutting food, phone , fixing hair,  bathing , dressing    Currently in Pain? No/denies            [facility] OT Assessment - 08/20/16 0001      AROM   Right Elbow Flexion 135   Right Elbow Extension -38   Right Forearm Pronation 60 Degrees   Right Forearm Supination 70 Degrees     Strength   Right Hand Grip (lbs) 14   Right Hand Lateral Pinch 6 lbs   Right Hand 3 Point Pinch 5 lbs   Left Hand Grip (lbs) 31   Left Hand Lateral Pinch 14 lbs   Left Hand 3 Point Pinch 9 lbs                  OT Treatments/Exercises (OP) - 08/20/16 0001      RUE Paraffin   Number Minutes Paraffin 10 Minutes   RUE Paraffin Location Hand   Comments AT [facility] to increase ROM in digits flexion and  coban wrap done - forearm and elbow too - kept forearm in pronation stretch       Elbow extention -38 coming in , end of session -32 , PROM -24  Able to extend wrist extention to neutral coming in with ease    Paraffin done to hand and elbow -coban flexion strap to each digit - while in paraffin and heating pad - held in pronation stretch  PROM composite flexion of all digits  Place and hold  BTE for gripper  24 lbs - 120 sec each    BTE CPM wrist extention - 200 sec each  AROM wrist extention off paddle , and then 1 lbs weight - each 15 reps  PROM by OT  for pronation - prolonged stretch  Followed by 2 lbs supported for pronation from neutral - 20 reps  Measured grip and prehension strength - see flowsheet Add 3 point and lat grip to light blue putty  PROM over edge of table for wrist extention   Lantz ESP splint on - and review and work on elbow extention  Followed by doing PREE and simulating some of the tasks - able to carry 5 lbs - but not 6 lbs  Throwing ball with 10 % difficulty  Cutting food trouble holding utencil  PREE score               OT Education - 08/20/16 1433    Education provided Yes   Education Details HEP for PROM wrist extention - add prehension for putty    Person(s) Educated Patient   Methods Explanation;Demonstration;Tactile cues;Verbal cues   Comprehension Verbal cues required;Returned demonstration;Verbalized understanding          OT Short Term Goals - 08/20/16 1436      OT SHORT TERM GOAL #1   Title R hand digits flexion able to touch palm to hold toothbrush and  utencils   Baseline PRogressing - see flowsheet    Time 3   Period Weeks   Status On-going     OT SHORT TERM GOAL #2   Title Pt independing in HEP to decrease scar tissue, increase  digits and wrist AROM to turn doorknob, do puzzles, wash dishes   Baseline progressing - still hard to turn doorknob , hold tight dishes and [place] for puzzles    Status On-going     OT SHORT TERM GOAL #3   Title R wrist sup/pro and wrist extention improve with more than30 degrees to turn doorknob and pages, use sweeper duster   Baseline wrist ext, sup and pronation progressing but still hard time using    Time 3   Period Weeks   Status On-going           OT Long Term Goals - 08/20/16 1438      OT LONG TERM GOAL #1   Title R elbow extention improve with more than 30 degrees  to  tie shoes and reach in pocket   Baseline still impaired - 38 walk in ext, PROM -24 this date   Time 4   Period Weeks   Status On-going     OT LONG TERM GOAL #2   Title R elbow flexion improve with with more than 20 degrees to do hair, apply deodorant, do jewelry    Status Achieved     OT LONG TERM GOAL #3   Title R grip strength improve for pt to have more than 50% grip compare to L hand    Baseline R 14, L 31 lbs   Time 4   Period Weeks   Status On-going     OT LONG TERM GOAL #4   Title Function on PREE improve more than 20 points    Baseline PREE function score 28/50   Time 4   Period Weeks   Status On-going               Plan - 08/20/16 1435    Clinical Impression Statement Pt making progress in digits flexion , wrist extention and functional use of R hand - still decrease in pronation , wrist extention , tight grip , elbow extention - cont with LANTZ splint at home and upgrading HEP as needed and strengthening  Rehab Potential Good   OT Frequency 2x / week   OT Duration 4 weeks   OT Treatment/Interventions Self-care/ADL training;Moist Heat;Fluidtherapy;Splinting;Patient/family education;Therapeutic exercises;Contrast Bath;Ultrasound;Scar  mobilization;Passive range of motion;Parrafin;Manual Therapy   Plan increase digits flexion , pronation  wrist and elbow extention    OT Home Exercise Plan see pt instruction    Consulted and Agree with Plan of Care Patient      Patient will benefit from skilled therapeutic intervention in order to improve the following deficits and impairments:  Decreased range of motion, Impaired flexibility, Increased edema, Decreased knowledge of precautions, Impaired UE functional use, Pain, Decreased strength  Visit Diagnosis: Stiffness of right elbow, not elsewhere classified - Plan: Ot plan of care cert/re-cert  Stiffness of right hand, not elsewhere classified - Plan: Ot plan of care cert/re-cert  Stiffness of right wrist, not elsewhere classified - Plan: Ot plan of care cert/re-cert  Numbness and tingling of hand - Plan: Ot plan of care cert/re-cert  Muscle weakness (generalized) - Plan: Ot plan of care cert/re-cert    Problem List Patient Active Problem List   Diagnosis Date Noted  . 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 Atypical ductal hyperplasia, breast 09/26/2012    Class: Diagnosis of    Rosalyn Gess OTR/L,CLT 08/20/2016, 2:50 PM  [facility] [address], [place], [address] Phone: [phone]   Fax:  [phone]  Name: Megan Hodge MRN: [id number] Date of Birth: 07-22-1945

## 2016-08-20 NOTE — Patient Instructions (Signed)
Add 3 point and lat grip to light blue putty  PROM over edge of table for wrist extention

## 2016-08-23 ENCOUNTER — Ambulatory Visit: Payer: PPO | Admitting: Occupational Therapy

## 2016-08-23 DIAGNOSIS — M25641 Stiffness of right hand, not elsewhere classified: Secondary | ICD-10-CM

## 2016-08-23 DIAGNOSIS — M6281 Muscle weakness (generalized): Secondary | ICD-10-CM

## 2016-08-23 DIAGNOSIS — M25621 Stiffness of right elbow, not elsewhere classified: Secondary | ICD-10-CM

## 2016-08-23 DIAGNOSIS — M25631 Stiffness of right wrist, not elsewhere classified: Secondary | ICD-10-CM

## 2016-08-23 DIAGNOSIS — R200 Anesthesia of skin: Secondary | ICD-10-CM

## 2016-08-23 DIAGNOSIS — R202 Paresthesia of skin: Secondary | ICD-10-CM

## 2016-08-23 NOTE — Patient Instructions (Addendum)
Same HEP - add table slides for wrist extention

## 2016-08-23 NOTE — Therapy (Signed)
[facility] [address] [place], [address] Phone: [phone]   Fax:  [phone]  Occupational Therapy Treatment  Patient Details  Name: Megan Hodge MRN: [id number] Date of Birth: 12-01-1945 Referring Provider: Celesta Aver  Encounter Date: 08/23/2016      OT End of Session - 08/23/16 1512    Visit Number 26   Number of Visits 33   Date for OT Re-Evaluation 09/17/16   OT Start Time 1356   OT Stop Time 1448   OT Time Calculation (min) 52 min   Activity Tolerance Patient tolerated treatment well   Behavior During Therapy [facility] for tasks assessed/performed      Past Medical History:  Diagnosis Date  . Abnormal mammogram, unspecified    right breast  . Cancer ([place]) 1968   cervical cancer  . Hypertension 1995    Past Surgical History:  Procedure Laterality Date  . ABDOMINAL HYSTERECTOMY  1993  . APPENDECTOMY  1993  . BREAST BIOPSY Right 1992  . BREAST BIOPSY Right March 2014   Atypical ductal hyperplasia. Failed trial of tamoxifen for chemoprevention.  . COLONOSCOPY  08/2014   Dr. Vira Agar  . DILATION AND CURETTAGE OF UTERUS  1968    There were no vitals filed for this visit.      Subjective Assessment - 08/23/16 1404    Subjective  My wrist and hand was hurting yesterday really bad - but more soreness - not pain - but I think I did to much maybe with putty - using it more - just slow   Patient Stated Goals Want to use my R dominant hand like prior to ROM - brushing teeth, eating , cutting food, phone , fixing hair,  bathing , dressing    Currently in Pain? No/denies                      OT Treatments/Exercises (OP) - 08/23/16 0001      RUE Paraffin   Number Minutes Paraffin 10 Minutes   RUE Paraffin Location Hand   Comments AT [facility] to increase digits flexion and pronation - with coban flexion strap for digits      Paraffin done to hand and elbow -coban flexion strap to  each digit - while in paraffin and heating pad - held in pronation stretch  PROM composite flexion of all digits  Place and hold  BTE for gripper 30 lbs - 120 sec each    BTE CPM wrist extention - 200 sec each  AROM wrist extention , AAROM and place and hold 10 reps each  2 lbs wrist extention 10 reps    PROM by OT for pronation - prolonged stretch  Followed by 2 lbs supported for pronation from neutral - 20 reps  Wrist extention slides on table -20 reps on rolled up towel  Add to HEP  2lbs weight for RD, UD 10 reps  Supine - prolonged stretch and myofascial stretch for elbow extention - 3 x 81min  2lbs weight in supine and standing for elbow extention - in neutral and supination  10 reps             OT Education - 08/23/16 1512    Education Details HEP    Person(s) Educated Patient   Methods Explanation;Demonstration;Tactile cues;Verbal cues   Comprehension Verbal cues required;Returned demonstration;Verbalized understanding          OT Short Term Goals - 08/20/16 1436  OT SHORT TERM GOAL #1   Title R hand digits flexion able to touch palm to hold toothbrush and  utencils   Baseline PRogressing - see flowsheet   Time 3   Period Weeks   Status On-going     OT SHORT TERM GOAL #2   Title Pt independing in HEP to decrease scar tissue, increase  digits and wrist AROM to turn doorknob, do puzzles, wash dishes   Baseline progressing - still hard to turn doorknob , hold tight dishes and [place] for puzzles    Status On-going     OT SHORT TERM GOAL #3   Title R wrist sup/pro and wrist extention improve with more than30 degrees to turn doorknob and pages, use sweeper duster   Baseline wrist ext, sup and pronation progressing but still hard time using    Time 3   Period Weeks   Status On-going           OT Long Term Goals - 08/20/16 1438      OT LONG TERM GOAL #1   Title R elbow extention improve with more than 30 degrees  to  tie shoes and reach in  pocket   Baseline still impaired - 38 walk in ext, PROM -24 this date   Time 4   Period Weeks   Status On-going     OT LONG TERM GOAL #2   Title R elbow flexion improve with with more than 20 degrees to do hair, apply deodorant, do jewelry    Status Achieved     OT LONG TERM GOAL #3   Title R grip strength improve for pt to have more than 50% grip compare to L hand    Baseline R 14, L 31 lbs   Time 4   Period Weeks   Status On-going     OT LONG TERM GOAL #4   Title Function on PREE improve more than 20 points    Baseline PREE function score 28/50   Time 4   Period Weeks   Status On-going               Plan - 08/23/16 1513    Clinical Impression Statement Pt making slow but steady progress in wrist ext, elbow extention , fisting - adjust wrist custom splint for extention next session - increase weight and putty as needed    Rehab Potential Good   OT Frequency 2x / week   OT Duration 4 weeks   OT Treatment/Interventions Self-care/ADL training;Moist Heat;Fluidtherapy;Splinting;Patient/family education;Therapeutic exercises;Contrast Bath;Ultrasound;Scar mobilization;Passive range of motion;Parrafin;Manual Therapy   Plan adjust wrist extention splint   OT Home Exercise Plan see pt instruction    Consulted and Agree with Plan of Care Patient      Patient will benefit from skilled therapeutic intervention in order to improve the following deficits and impairments:  Decreased range of motion, Impaired flexibility, Increased edema, Decreased knowledge of precautions, Impaired UE functional use, Pain, Decreased strength  Visit Diagnosis: Stiffness of right elbow, not elsewhere classified  Stiffness of right hand, not elsewhere classified  Stiffness of right wrist, not elsewhere classified  Numbness and tingling of hand  Muscle weakness (generalized)    Problem List Patient Active Problem List   Diagnosis Date Noted  . Atypical ductal hyperplasia of breast  09/25/2014  . Atypical ductal hyperplasia, breast 09/26/2012    Class: Diagnosis of    Rosalyn Gess OTR/L,CLT 08/23/2016, 3:15 PM  [place] [facility]  [address] [place], [address] Phone: [phone]   Fax:  [phone]  Name: SANTRESA BOHLINGER MRN: [id number] Date of Birth: [birth year]

## 2016-08-26 ENCOUNTER — Ambulatory Visit: Payer: PPO | Admitting: Occupational Therapy

## 2016-08-26 DIAGNOSIS — M25631 Stiffness of right wrist, not elsewhere classified: Secondary | ICD-10-CM

## 2016-08-26 DIAGNOSIS — M25621 Stiffness of right elbow, not elsewhere classified: Secondary | ICD-10-CM

## 2016-08-26 DIAGNOSIS — R200 Anesthesia of skin: Secondary | ICD-10-CM

## 2016-08-26 DIAGNOSIS — R202 Paresthesia of skin: Secondary | ICD-10-CM

## 2016-08-26 DIAGNOSIS — M25641 Stiffness of right hand, not elsewhere classified: Secondary | ICD-10-CM

## 2016-08-26 DIAGNOSIS — M6281 Muscle weakness (generalized): Secondary | ICD-10-CM

## 2016-08-26 NOTE — Therapy (Signed)
[facility] [address], [place], [address] Phone: [phone]   Fax:  [phone]  Occupational Therapy Treatment  Patient Details  Name: Megan Hodge MRN: [id number] Date of Birth: 1946/01/18 Referring Provider: Celesta Aver  Encounter Date: 08/26/2016      OT End of Session - 08/26/16 1821    Visit Number 27   Number of Visits 33   Date for OT Re-Evaluation 09/17/16   OT Start Time 1446   OT Stop Time 1530   OT Time Calculation (min) 44 min   Activity Tolerance Patient tolerated treatment well   Behavior During Therapy [facility] for tasks assessed/performed      Past Medical History:  Diagnosis Date  . Abnormal mammogram, unspecified    right breast  . Cancer ([place]) 1968   cervical cancer  . Hypertension 1995    Past Surgical History:  Procedure Laterality Date  . ABDOMINAL HYSTERECTOMY  1993  . APPENDECTOMY  1993  . BREAST BIOPSY Right 1992  . BREAST BIOPSY Right March 2014   Atypical ductal hyperplasia. Failed trial of tamoxifen for chemoprevention.  . COLONOSCOPY  08/2014   Dr. Vira Agar  . DILATION AND CURETTAGE OF UTERUS  1968    There were no vitals filed for this visit.      Subjective Assessment - 08/26/16 1816    Subjective  I am starting to use my hand more,   I can tell it is slowly getting better elbow , wrist and hand - I did bring the splint for you to adjust    Patient Stated Goals Want to use my R dominant hand like prior to ROM - brushing teeth, eating , cutting food, phone , fixing hair,  bathing , dressing    Currently in Pain? No/denies                      OT Treatments/Exercises (OP) - 08/26/16 0001      RUE Paraffin   Number Minutes Paraffin 10 Minutes   RUE Paraffin Location Hand   Comments at [facility] to increase ROM  at elbow and wrist /diigits       Paraffin done to hand and elbow -coban flexion strap to each digit - while in paraffin and heating pad  - held in pronation stretch  PROM composite flexion of all digits  Place and hold     BTE CPM wrist extention - 200 sec each  X 2 AROM wrist extention , AAROM and place and hold 10 reps each     PROM by OT for pronation - prolonged stretch  Followed by 2 lbs supported for pronation from neutral - 20 reps  Wrist extention slides on table -20 reps on rolled up towel  Add to HEP  2lbs weight for RD, UD 10 reps Add some teal putty to light blue to increase resistance - same exercises   graston tools to volar forearm prior to stretches - tool nr 2 for sweeping  Supine - prolonged stretch and myofascial stretch for elbow extention - 3 x 5min  2lbs weight in supine and standing for elbow extention - in neutral and supination  10 reps  Remolded wrist splint - to 45 degrees to increase extention -            OT Education - 08/26/16 1819    Education provided Yes   Education Details HEP    Person(s) Educated Patient  Methods Explanation;Demonstration;Tactile cues;Verbal cues   Comprehension Verbalized understanding;Returned demonstration;Verbal cues required          OT Short Term Goals - 08/20/16 1436      OT SHORT TERM GOAL #1   Title R hand digits flexion able to touch palm to hold toothbrush and  utencils   Baseline PRogressing - see flowsheet   Time 3   Period Weeks   Status On-going     OT SHORT TERM GOAL #2   Title Pt independing in HEP to decrease scar tissue, increase  digits and wrist AROM to turn doorknob, do puzzles, wash dishes   Baseline progressing - still hard to turn doorknob , hold tight dishes and [place] for puzzles    Status On-going     OT SHORT TERM GOAL #3   Title R wrist sup/pro and wrist extention improve with more than30 degrees to turn doorknob and pages, use sweeper duster   Baseline wrist ext, sup and pronation progressing but still hard time using    Time 3   Period Weeks   Status On-going           OT Long Term Goals -  08/20/16 1438      OT LONG TERM GOAL #1   Title R elbow extention improve with more than 30 degrees  to  tie shoes and reach in pocket   Baseline still impaired - 38 walk in ext, PROM -24 this date   Time 4   Period Weeks   Status On-going     OT LONG TERM GOAL #2   Title R elbow flexion improve with with more than 20 degrees to do hair, apply deodorant, do jewelry    Status Achieved     OT LONG TERM GOAL #3   Title R grip strength improve for pt to have more than 50% grip compare to L hand    Baseline R 14, L 31 lbs   Time 4   Period Weeks   Status On-going     OT LONG TERM GOAL #4   Title Function on PREE improve more than 20 points    Baseline PREE function score 28/50   Time 4   Period Weeks   Status On-going               Plan - 08/26/16 1822    Clinical Impression Statement Pt making progress in wrist extention , digits flexion and elbow extention - slow but steady - did adjust wrist extention splint to about 45 - cont to increase ROM    Rehab Potential Good   OT Frequency 2x / week   OT Duration 4 weeks   OT Treatment/Interventions Self-care/ADL training;Moist Heat;Fluidtherapy;Splinting;Patient/family education;Therapeutic exercises;Contrast Bath;Ultrasound;Scar mobilization;Passive range of motion;Parrafin;Manual Therapy   Plan pronation, wrist extention , digits flexion digits    OT Home Exercise Plan see pt instruction    Consulted and Agree with Plan of Care Patient      Patient will benefit from skilled therapeutic intervention in order to improve the following deficits and impairments:  Decreased range of motion, Impaired flexibility, Increased edema, Decreased knowledge of precautions, Impaired UE functional use, Pain, Decreased strength  Visit Diagnosis: Stiffness of right elbow, not elsewhere classified  Stiffness of right hand, not elsewhere classified  Stiffness of right wrist, not elsewhere classified  Numbness and tingling of  hand  Muscle weakness (generalized)    Problem List Patient Active Problem List   Diagnosis Date Noted  . Atypical  ductal hyperplasia of breast 09/25/2014  . Atypical ductal hyperplasia, breast 09/26/2012    Class: Diagnosis of    Rosalyn Gess OTR/L,CLT 08/26/2016, 6:28 PM  [place] [facility] [address], [place], [address] Phone: [phone]   Fax:  [phone]  Name: BRYTTANI PFLUGH MRN: [id number] Date of Birth: 16-Dec-1945

## 2016-08-26 NOTE — Patient Instructions (Addendum)
Upgrade light  Blue putty with teal added

## 2016-09-02 ENCOUNTER — Ambulatory Visit: Payer: PPO | Admitting: Occupational Therapy

## 2016-09-02 DIAGNOSIS — M25641 Stiffness of right hand, not elsewhere classified: Secondary | ICD-10-CM

## 2016-09-02 DIAGNOSIS — M25621 Stiffness of right elbow, not elsewhere classified: Secondary | ICD-10-CM

## 2016-09-02 DIAGNOSIS — R202 Paresthesia of skin: Secondary | ICD-10-CM

## 2016-09-02 DIAGNOSIS — R200 Anesthesia of skin: Secondary | ICD-10-CM

## 2016-09-02 DIAGNOSIS — M25631 Stiffness of right wrist, not elsewhere classified: Secondary | ICD-10-CM

## 2016-09-02 DIAGNOSIS — M6281 Muscle weakness (generalized): Secondary | ICD-10-CM

## 2016-09-02 NOTE — Therapy (Signed)
[place] [facility] [address], [place], [address] Phone: [phone]   Fax:  [phone]  Occupational Therapy Treatment  Patient Details  Name: Megan Hodge MRN: [id number] Date of Birth: 11-04-1945 Referring Provider: Celesta Aver  Encounter Date: 09/02/2016      OT End of Session - 09/02/16 1811    Visit Number 28   Number of Visits 33   Date for OT Re-Evaluation 09/17/16   OT Start Time 1245   OT Stop Time 1330   OT Time Calculation (min) 45 min   Activity Tolerance Patient tolerated treatment well   Behavior During Therapy [facility] for tasks assessed/performed      Past Medical History:  Diagnosis Date  . Abnormal mammogram, unspecified    right breast  . Cancer ([place]) 1968   cervical cancer  . Hypertension 1995    Past Surgical History:  Procedure Laterality Date  . ABDOMINAL HYSTERECTOMY  1993  . APPENDECTOMY  1993  . BREAST BIOPSY Right 1992  . BREAST BIOPSY Right March 2014   Atypical ductal hyperplasia. Failed trial of tamoxifen for chemoprevention.  . COLONOSCOPY  08/2014   Dr. Vira Agar  . DILATION AND CURETTAGE OF UTERUS  1968    There were no vitals filed for this visit.      Subjective Assessment - 09/02/16 1807    Subjective  I did okay - I wear my wrist splint and glove a lot - maybe to much - but it helps - using it more by arm    Patient Stated Goals Want to use my R dominant hand like prior to ROM - brushing teeth, eating , cutting food, phone , fixing hair,  bathing , dressing    Currently in Pain? No/denies            [facility] OT Assessment - 09/02/16 0001      AROM   Right Forearm Pronation 60 Degrees   Right Forearm Supination 80 Degrees   Right Wrist Extension 40 Degrees     Strength   Right Hand Grip (lbs) 15   Right Hand Lateral Pinch 18 lbs   Right Hand 3 Point Pinch 6 lbs   Left Hand Grip (lbs) 31   Left Hand Lateral Pinch 14 lbs   Left Hand 3 Point Pinch 9 lbs      Right Hand AROM   R Index  MCP 0-90 75 Degrees   R Index PIP 0-100 90 Degrees   R Long  MCP 0-90 80 Degrees   R Long PIP 0-100 90 Degrees   R Ring  MCP 0-90 80 Degrees   R Ring PIP 0-100 95 Degrees   R Little  MCP 0-90 75 Degrees   R Little PIP 0-100 100 Degrees      Measure digits and wrist AROM  Elbow AROM for extention  See flowsheet  Fitted with knuckle bender med - used for 5 min - PROM for digits flexion in it  Followed by AROM full fist   pt to use at home to increase MC flexion   BTE gripper at 30 lbs and 40 lbs 120 sec each   Paraffin done to hand and elbow - stretch into pronation  Prior to   BTE for  wrist extention - 200 sec each  X 2 AROM wrist extention , AAROM and place and hold 10 reps each    PROM by OT for pronation - prolonged stretch  Followed by  1 kg ball and 2kg ball for sup/pro from palm to palm                        OT Education - 09/02/16 1811    Education provided Yes   Education Details knucklebender use   Person(s) Educated Patient   Methods Explanation;Demonstration;Tactile cues   Comprehension Returned demonstration;Verbalized understanding          OT Short Term Goals - 08/20/16 1436      OT SHORT TERM GOAL #1   Title R hand digits flexion able to touch palm to hold toothbrush and  utencils   Baseline PRogressing - see flowsheet   Time 3   Period Weeks   Status On-going     OT SHORT TERM GOAL #2   Title Pt independing in HEP to decrease scar tissue, increase  digits and wrist AROM to turn doorknob, do puzzles, wash dishes   Baseline progressing - still hard to turn doorknob , hold tight dishes and [place] for puzzles    Status On-going     OT SHORT TERM GOAL #3   Title R wrist sup/pro and wrist extention improve with more than30 degrees to turn doorknob and pages, use sweeper duster   Baseline wrist ext, sup and pronation progressing but still hard time using    Time 3   Period Weeks   Status On-going            OT Long Term Goals - 08/20/16 1438      OT LONG TERM GOAL #1   Title R elbow extention improve with more than 30 degrees  to  tie shoes and reach in pocket   Baseline still impaired - 38 walk in ext, PROM -24 this date   Time 4   Period Weeks   Status On-going     OT LONG TERM GOAL #2   Title R elbow flexion improve with with more than 20 degrees to do hair, apply deodorant, do jewelry    Status Achieved     OT LONG TERM GOAL #3   Title R grip strength improve for pt to have more than 50% grip compare to L hand    Baseline R 14, L 31 lbs   Time 4   Period Weeks   Status On-going     OT LONG TERM GOAL #4   Title Function on PREE improve more than 20 points    Baseline PREE function score 28/50   Time 4   Period Weeks   Status On-going               Plan - 09/02/16 1812    Clinical Impression Statement Pt made good progress in digits flexion and doubled her wrist extention this date - elbow extnetion progressing - pt to cont to increase ROM , strength - and will remold wrist extentiion splint    Rehab Potential Good   OT Frequency 2x / week   OT Duration 4 weeks   OT Treatment/Interventions Self-care/ADL training;Moist Heat;Fluidtherapy;Splinting;Patient/family education;Therapeutic exercises;Contrast Bath;Ultrasound;Scar mobilization;Passive range of motion;Parrafin;Manual Therapy   Plan Focus on elbow and pronation - as well as remold wrist splint    OT Home Exercise Plan see pt instruction    Consulted and Agree with Plan of Care Patient      Patient will benefit from skilled therapeutic intervention in order to improve the following deficits and impairments:  Decreased range of motion, Impaired flexibility, Increased edema,  Decreased knowledge of precautions, Impaired UE functional use, Pain, Decreased strength  Visit Diagnosis: Stiffness of right elbow, not elsewhere classified  Stiffness of right hand, not elsewhere classified  Stiffness of  right wrist, not elsewhere classified  Numbness and tingling of hand  Muscle weakness (generalized)    Problem List Patient Active Problem List   Diagnosis Date Noted  . 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 Atypical ductal hyperplasia, breast 09/26/2012    Class: Diagnosis of    Rosalyn Gess OTR/L,CLT 09/02/2016, 6:14 PM  [place] [facility] [address], [place], [address] Phone: [phone]   Fax:  [phone]  Name: Megan Hodge MRN: [id number] Date of Birth: 1946-05-03

## 2016-09-02 NOTE — Patient Instructions (Signed)
Same HEP but add  Fitted with knuckle bender med - used for 5 min - PROM for digits flexion in it  Followed by AROM full fist   pt to use at home to increase MC flexion

## 2016-09-08 ENCOUNTER — Ambulatory Visit: Payer: PPO | Admitting: Occupational Therapy

## 2016-09-08 DIAGNOSIS — R202 Paresthesia of skin: Secondary | ICD-10-CM

## 2016-09-08 DIAGNOSIS — M25641 Stiffness of right hand, not elsewhere classified: Secondary | ICD-10-CM

## 2016-09-08 DIAGNOSIS — M25621 Stiffness of right elbow, not elsewhere classified: Secondary | ICD-10-CM | POA: Diagnosis not present

## 2016-09-08 DIAGNOSIS — M25631 Stiffness of right wrist, not elsewhere classified: Secondary | ICD-10-CM

## 2016-09-08 DIAGNOSIS — R200 Anesthesia of skin: Secondary | ICD-10-CM

## 2016-09-08 DIAGNOSIS — M6281 Muscle weakness (generalized): Secondary | ICD-10-CM

## 2016-09-08 NOTE — Therapy (Signed)
[place] [facility] [address] [place], [address] Phone: [phone]   Fax:  [phone]  Occupational Therapy Treatment  Patient Details  Name: Megan Hodge MRN: [id number] Date of Birth: August 28, 1945 Referring Provider: Celesta Aver  Encounter Date: 09/08/2016      OT End of Session - 09/08/16 1818    Visit Number 29   Number of Visits 33   Date for OT Re-Evaluation 09/17/16   OT Start Time 1225   OT Stop Time 1330   OT Time Calculation (min) 65 min   Activity Tolerance Patient tolerated treatment well   Behavior During Therapy [facility] for tasks assessed/performed      Past Medical History:  Diagnosis Date  . Abnormal mammogram, unspecified    right breast  . Cancer ([place]) 1968   cervical cancer  . Hypertension 1995    Past Surgical History:  Procedure Laterality Date  . ABDOMINAL HYSTERECTOMY  1993  . APPENDECTOMY  1993  . BREAST BIOPSY Right 1992  . BREAST BIOPSY Right March 2014   Atypical ductal hyperplasia. Failed trial of tamoxifen for chemoprevention.  . COLONOSCOPY  08/2014   Dr. Vira Agar  . DILATION AND CURETTAGE OF UTERUS  1968    There were no vitals filed for this visit.      Subjective Assessment - 09/08/16 1816    Subjective  Doing okay - some days my hand better than other times- my thumb and wrist hurts with using the splint to turn palm over - and feels like I am all the way over - using it more    Patient Stated Goals Want to use my R dominant hand like prior to ROM - brushing teeth, eating , cutting food, phone , fixing hair,  bathing , dressing    Currently in Pain? No/denies                      OT Treatments/Exercises (OP) - 09/08/16 0001      RUE Paraffin   Number Minutes Paraffin 10 Minutes   RUE Paraffin Location Hand   Comments AT [facility] to increase pronation and digits flexion - after keeping it in pronation and digits flexion with heatinpad       PROM  for wrist extention - 15 reps - hold 10 sec  Table slides 20 reps     Modify wrist splint to gradually increase extention of wrist  To 30-40 degrees - pt to wear as done in the past 2 wks to increase wrist extention    Paraffin done to hand and elbow - hand held in fist  And heating pad to increase ROM   PROM composite flexion of all digits  Place and hold    BTE for gripper 40 lbs - 120 sec each  3 point grip 20 lbs - 120 sec   BTE CPM wrist extention - 200 sec each - and then AROM wrist extention off palm piece in tool 701 12 reps and in fist keep and wrist extention 12 reps Pronation 2 lbs 10 reps  3 lbs supported 10 reps  X 2    Adjusted LANTZ splint cuff for forearm to prevent sliding down and pushing into thumb CMC - pt to focus more on elbow extention - and not supination in combination             OT Education - 09/08/16 1818    Education provided Yes   Education  Details splint adjusted  and changes wearing of LANTZ splint    Person(s) Educated Patient   Methods Explanation;Demonstration;Tactile cues;Verbal cues   Comprehension Verbal cues required;Returned demonstration;Verbalized understanding          OT Short Term Goals - 08/20/16 1436      OT SHORT TERM GOAL #1   Title R hand digits flexion able to touch palm to hold toothbrush and  utencils   Baseline PRogressing - see flowsheet   Time 3   Period Weeks   Status On-going     OT SHORT TERM GOAL #2   Title Pt independing in HEP to decrease scar tissue, increase  digits and wrist AROM to turn doorknob, do puzzles, wash dishes   Baseline progressing - still hard to turn doorknob , hold tight dishes and [place] for puzzles    Status On-going     OT SHORT TERM GOAL #3   Title R wrist sup/pro and wrist extention improve with more than30 degrees to turn doorknob and pages, use sweeper duster   Baseline wrist ext, sup and pronation progressing but still hard time using    Time 3   Period Weeks    Status On-going           OT Long Term Goals - 08/20/16 1438      OT LONG TERM GOAL #1   Title R elbow extention improve with more than 30 degrees  to  tie shoes and reach in pocket   Baseline still impaired - 38 walk in ext, PROM -24 this date   Time 4   Period Weeks   Status On-going     OT LONG TERM GOAL #2   Title R elbow flexion improve with with more than 20 degrees to do hair, apply deodorant, do jewelry    Status Achieved     OT LONG TERM GOAL #3   Title R grip strength improve for pt to have more than 50% grip compare to L hand    Baseline R 14, L 31 lbs   Time 4   Period Weeks   Status On-going     OT LONG TERM GOAL #4   Title Function on PREE improve more than 20 points    Baseline PREE function score 28/50   Time 4   Period Weeks   Status On-going               Plan - 09/08/16 1819    Clinical Impression Statement Pt cont to make progress in supination, wrist extention and digits flexion - using it more and strengthening - adjusted her wrist extention splint to 30-40 and LANTZ splint to increase wrist extention without pain at [facility] of thumb    Rehab Potential Good   OT Frequency 2x / week   OT Duration 4 weeks   OT Treatment/Interventions Self-care/ADL training;Moist Heat;Fluidtherapy;Splinting;Patient/family education;Therapeutic exercises;Contrast Bath;Ultrasound;Scar mobilization;Passive range of motion;Parrafin;Manual Therapy   Plan cont to increase wrist extention , fist , elbow extentionand pronation   OT Home Exercise Plan see pt instruction    Consulted and Agree with Plan of Care Patient      Patient will benefit from skilled therapeutic intervention in order to improve the following deficits and impairments:  Decreased range of motion, Impaired flexibility, Increased edema, Decreased knowledge of precautions, Impaired UE functional use, Pain, Decreased strength  Visit Diagnosis: Stiffness of right elbow, not elsewhere  classified  Stiffness of right hand, not elsewhere classified  Stiffness of right wrist,  not elsewhere classified  Numbness and tingling of hand  Muscle weakness (generalized)    Problem List Patient Active Problem List   Diagnosis Date Noted  . 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 Atypical ductal hyperplasia, breast 09/26/2012    Class: Diagnosis of    Rosalyn Gess OTR/L,CLT  09/08/2016, 6:26 PM  [place] [facility] [address], [place], [address] Phone: [phone]   Fax:  [phone]  Name: Megan Hodge MRN: [id number] Date of Birth: 12/19/1945

## 2016-09-08 NOTE — Patient Instructions (Signed)
Same HEP - adjusted splints

## 2016-09-10 ENCOUNTER — Ambulatory Visit: Payer: PPO | Attending: Student | Admitting: Occupational Therapy

## 2016-09-10 DIAGNOSIS — R200 Anesthesia of skin: Secondary | ICD-10-CM | POA: Diagnosis not present

## 2016-09-10 DIAGNOSIS — M25641 Stiffness of right hand, not elsewhere classified: Secondary | ICD-10-CM | POA: Diagnosis not present

## 2016-09-10 DIAGNOSIS — M25631 Stiffness of right wrist, not elsewhere classified: Secondary | ICD-10-CM

## 2016-09-10 DIAGNOSIS — M6281 Muscle weakness (generalized): Secondary | ICD-10-CM | POA: Diagnosis not present

## 2016-09-10 DIAGNOSIS — R202 Paresthesia of skin: Secondary | ICD-10-CM | POA: Diagnosis not present

## 2016-09-10 DIAGNOSIS — M25621 Stiffness of right elbow, not elsewhere classified: Secondary | ICD-10-CM

## 2016-09-10 NOTE — Patient Instructions (Addendum)
   Same HEP for splint , ROM , strengthening

## 2016-09-10 NOTE — Therapy (Signed)
[place] [facility] [address] [place], [address] Phone: [phone]   Fax:  [phone]  Occupational Therapy Treatment  Patient Details  Name: Megan Hodge MRN: [id number] Date of Birth: [birth year] Referring Provider: Celesta Aver  Encounter Date: 09/10/2016      OT End of Session - 09/10/16 1147    Visit Number 30   Number of Visits 33   Date for OT Re-Evaluation 09/17/16   OT Start Time 1130   OT Stop Time 1214   OT Time Calculation (min) 44 min   Activity Tolerance Patient tolerated treatment well   Behavior During Therapy [facility] for tasks assessed/performed      Past Medical History:  Diagnosis Date  . Abnormal mammogram, unspecified    right breast  . Cancer ([place]) 1968   cervical cancer  . Hypertension 1995    Past Surgical History:  Procedure Laterality Date  . ABDOMINAL HYSTERECTOMY  1993  . APPENDECTOMY  1993  . BREAST BIOPSY Right 1992  . BREAST BIOPSY Right March 2014   Atypical ductal hyperplasia. Failed trial of tamoxifen for chemoprevention.  . COLONOSCOPY  08/2014   Dr. Vira Agar  . DILATION AND CURETTAGE OF UTERUS  1968    There were no vitals filed for this visit.      Subjective Assessment - 09/10/16 1146    Subjective  Did okay - I am using the splint - I am coming close to max it feels for rotation of wrist- I like the new position of the wrist splint you made    Patient Stated Goals Want to use my R dominant hand like prior to ROM - brushing teeth, eating , cutting food, phone , fixing hair,  bathing , dressing    Currently in Pain? No/denies                      OT Treatments/Exercises (OP) - 09/10/16 0001      RUE Paraffin   Number Minutes Paraffin 10 Minutes   RUE Paraffin Location --  hand , wrist and elbow    Comments at [facility] to increase elbow extetnion - in supine with large heatingpad for ext stretch and     Elbow extention -38 this date -PROM - 34    Paraffin done to hand to elbow -  Supine large heatingpad for elbow extention 8 min  Graston tools for volar wrist , distal bicep and triceps - sweeping and scooping - nr 2 and 4 prior to PROM   PROM and prolonged stretch for elbow extention - with myofascial release to volar upper arm and bicep   AROM elbow extention afterwards   -30 and PROM -25  YTB for retraction  YTB for elbow extention 2 x 12 reps  Each   BTE for wrist extention  2 x 200 sec  AROM wrist extention 2 x 12 reps  Pronation PROM 10 reps  2 lbs pronation 12 reps               OT Education - 09/10/16 1147    Education provided Yes   Education Details HEP what to focus -   Person(s) Educated Patient   Methods Explanation;Demonstration;Tactile cues;Verbal cues   Comprehension Verbal cues required;Returned demonstration;Verbalized understanding          OT Short Term Goals - 08/20/16 1436      OT SHORT TERM GOAL #1   Title R hand digits flexion  able to touch palm to hold toothbrush and  utencils   Baseline PRogressing - see flowsheet   Time 3   Period Weeks   Status On-going     OT SHORT TERM GOAL #2   Title Pt independing in HEP to decrease scar tissue, increase  digits and wrist AROM to turn doorknob, do puzzles, wash dishes   Baseline progressing - still hard to turn doorknob , hold tight dishes and [place] for puzzles    Status On-going     OT SHORT TERM GOAL #3   Title R wrist sup/pro and wrist extention improve with more than30 degrees to turn doorknob and pages, use sweeper duster   Baseline wrist ext, sup and pronation progressing but still hard time using    Time 3   Period Weeks   Status On-going           OT Long Term Goals - 08/20/16 1438      OT LONG TERM GOAL #1   Title R elbow extention improve with more than 30 degrees  to  tie shoes and reach in pocket   Baseline still impaired - 38 walk in ext, PROM -24 this date   Time 4   Period Weeks   Status On-going     OT  LONG TERM GOAL #2   Title R elbow flexion improve with with more than 20 degrees to do hair, apply deodorant, do jewelry    Status Achieved     OT LONG TERM GOAL #3   Title R grip strength improve for pt to have more than 50% grip compare to L hand    Baseline R 14, L 31 lbs   Time 4   Period Weeks   Status On-going     OT LONG TERM GOAL #4   Title Function on PREE improve more than 20 points    Baseline PREE function score 28/50   Time 4   Period Weeks   Status On-going               Plan - 09/10/16 1148    Clinical Impression Statement Pt cont to make slow but steady progress - elbow extention -38 coming in and progress to -30in session - cont with focus on pronation, wrist ext, elbow exte and grip    Rehab Potential Good   OT Frequency 2x / week   OT Duration 4 weeks   OT Treatment/Interventions Self-care/ADL training;Moist Heat;Fluidtherapy;Splinting;Patient/family education;Therapeutic exercises;Contrast Bath;Ultrasound;Scar mobilization;Passive range of motion;Parrafin;Manual Therapy   OT Home Exercise Plan see pt instruction    Consulted and Agree with Plan of Care Patient      Patient will benefit from skilled therapeutic intervention in order to improve the following deficits and impairments:  Decreased range of motion, Impaired flexibility, Increased edema, Decreased knowledge of precautions, Impaired UE functional use, Pain, Decreased strength  Visit Diagnosis: Stiffness of right elbow, not elsewhere classified  Stiffness of right hand, not elsewhere classified  Stiffness of right wrist, not elsewhere classified  Numbness and tingling of hand  Muscle weakness (generalized)    Problem List Patient Active Problem List   Diagnosis Date Noted  . 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 Atypical ductal hyperplasia, breast 09/26/2012    Class: Diagnosis of    Rosalyn Gess OTR/L,CLT 09/10/2016, 2:58 PM  [place] [facility] [address], [place], [address] Phone: [phone]   Fax:  [phone]  Name: Megan  EMYAH Hodge MRN: [id number] Date of Birth: 08-02-1945

## 2016-09-16 ENCOUNTER — Ambulatory Visit: Payer: PPO | Admitting: Occupational Therapy

## 2016-09-16 DIAGNOSIS — M25621 Stiffness of right elbow, not elsewhere classified: Secondary | ICD-10-CM

## 2016-09-16 DIAGNOSIS — M6281 Muscle weakness (generalized): Secondary | ICD-10-CM

## 2016-09-16 DIAGNOSIS — R202 Paresthesia of skin: Secondary | ICD-10-CM

## 2016-09-16 DIAGNOSIS — M25631 Stiffness of right wrist, not elsewhere classified: Secondary | ICD-10-CM

## 2016-09-16 DIAGNOSIS — M25641 Stiffness of right hand, not elsewhere classified: Secondary | ICD-10-CM

## 2016-09-16 DIAGNOSIS — R200 Anesthesia of skin: Secondary | ICD-10-CM

## 2016-09-16 NOTE — Patient Instructions (Addendum)
Interlock fingers and do elbow flexion to neck L and R  And interlock and slide behind head 10 reps

## 2016-09-16 NOTE — Therapy (Signed)
[place] [facility] [address] [place], [address] Phone: [phone]   Fax:  [phone]  Occupational Therapy Treatment  Patient Details  Name: Megan Hodge MRN: [id number] Date of Birth: 1945-11-21 Referring Provider: Celesta Aver  Encounter Date: 09/16/2016      OT End of Session - 09/16/16 1137    Visit Number 31   Number of Visits 33   Date for OT Re-Evaluation 09/17/16   OT Start Time 1115   OT Stop Time 1201   OT Time Calculation (min) 46 min   Activity Tolerance Patient tolerated treatment well   Behavior During Therapy [facility] for tasks assessed/performed      Past Medical History:  Diagnosis Date  . Abnormal mammogram, unspecified    right breast  . Cancer ([place]) 1968   cervical cancer  . Hypertension 1995    Past Surgical History:  Procedure Laterality Date  . ABDOMINAL HYSTERECTOMY  1993  . APPENDECTOMY  1993  . BREAST BIOPSY Right 1992  . BREAST BIOPSY Right March 2014   Atypical ductal hyperplasia. Failed trial of tamoxifen for chemoprevention.  . COLONOSCOPY  08/2014   Dr. Vira Agar  . DILATION AND CURETTAGE OF UTERUS  1968    There were no vitals filed for this visit.      Subjective Assessment - 09/16/16 1136    Subjective  My fingers was so loose yesterday and using it more - and then wrist doing better - love the splint for wrist    Patient Stated Goals Want to use my R dominant hand like prior to ROM - brushing teeth, eating , cutting food, phone , fixing hair,  bathing , dressing    Currently in Pain? No/denies                      OT Treatments/Exercises (OP) - 09/16/16 0001      RUE Paraffin   Number Minutes Paraffin 10 Minutes   Comments AT [facility] to increase elbow flexion wit hlarge heatingpad for 4 mi nadn then pronation for 5 min       Elbow flexion 130 and PROM 135      Paraffin done to hand to elbow -  sitting  large heatingpad for elbow flexion and  pronation 5 min each   Graston tools for volar wrist , distal bicep and triceps - sweeping and scooping - nr 2 and 4 prior to PROM   Tight and trigger points to distal tricep and bicep with pain    PROM and prolonged stretch for flexion  contract and relax for elbow flexion 10 reps 3 lbs for elbow flexion to posterior neck  Interlock fingers and do elbow flexion to neck L and R  And interlock and slide behind head 10 reps  Elbow flexion at end 135 A and PROM 140   BTE for wrist extention  2 x 200 sec  Light blue putty mix with 1/2 green for gripping - 10 reps  Cont with at home             OT Education - 09/16/16 1137    Education provided Yes   Education Details HEP for ext and felxion    Person(s) Educated Patient   Methods Explanation;Demonstration;Tactile cues;Verbal cues   Comprehension Verbal cues required;Returned demonstration;Verbalized understanding          OT Short Term Goals - 08/20/16 1436      OT SHORT TERM  GOAL #1   Title R hand digits flexion able to touch palm to hold toothbrush and  utencils   Baseline PRogressing - see flowsheet   Time 3   Period Weeks   Status On-going     OT SHORT TERM GOAL #2   Title Pt independing in HEP to decrease scar tissue, increase  digits and wrist AROM to turn doorknob, do puzzles, wash dishes   Baseline progressing - still hard to turn doorknob , hold tight dishes and [place] for puzzles    Status On-going     OT SHORT TERM GOAL #3   Title R wrist sup/pro and wrist extention improve with more than30 degrees to turn doorknob and pages, use sweeper duster   Baseline wrist ext, sup and pronation progressing but still hard time using    Time 3   Period Weeks   Status On-going           OT Long Term Goals - 08/20/16 1438      OT LONG TERM GOAL #1   Title R elbow extention improve with more than 30 degrees  to  tie shoes and reach in pocket   Baseline still impaired - 38 walk in ext, PROM -24 this date   Time 4    Period Weeks   Status On-going     OT LONG TERM GOAL #2   Title R elbow flexion improve with with more than 20 degrees to do hair, apply deodorant, do jewelry    Status Achieved     OT LONG TERM GOAL #3   Title R grip strength improve for pt to have more than 50% grip compare to L hand    Baseline R 14, L 31 lbs   Time 4   Period Weeks   Status On-going     OT LONG TERM GOAL #4   Title Function on PREE improve more than 20 points    Baseline PREE function score 28/50   Time 4   Period Weeks   Status On-going               Plan - 09/16/16 1138    Clinical Impression Statement Pt making progress in elbow flexion and wrist extention - will reassess extention of elbow and grip next time - increase flexion to 140 PROM , and wrist extention 45 this date- cont with HEP and reassess LANTZ splint again for end range    Rehab Potential Good   OT Frequency 2x / week   OT Duration 4 weeks   OT Treatment/Interventions Self-care/ADL training;Moist Heat;Fluidtherapy;Splinting;Patient/family education;Therapeutic exercises;Contrast Bath;Ultrasound;Scar mobilization;Passive range of motion;Parrafin;Manual Therapy   Plan grip strength assess and LANTZ splint - work on elbow and wrist exte   OT Home Exercise Plan see pt instruction    Consulted and Agree with Plan of Care Patient      Patient will benefit from skilled therapeutic intervention in order to improve the following deficits and impairments:  Decreased range of motion, Impaired flexibility, Increased edema, Decreased knowledge of precautions, Impaired UE functional use, Pain, Decreased strength  Visit Diagnosis: Stiffness of right elbow, not elsewhere classified  Stiffness of right hand, not elsewhere classified  Stiffness of right wrist, not elsewhere classified  Numbness and tingling of hand  Muscle weakness (generalized)    Problem List Patient Active Problem List   Diagnosis Date Noted  . Atypical ductal  hyperplasia of breast 09/25/2014  . Atypical ductal hyperplasia, breast 09/26/2012    Class: Diagnosis of  Rosalyn Gess OTR/L,CLT 09/16/2016, 12:16 PM  [place] [facility] [address] [place], [address] Phone: [phone]   Fax:  [phone]  Name: HALEN MOSSBARGER MRN: [id number] Date of Birth: 08-30-1945

## 2016-09-20 DIAGNOSIS — Z1231 Encounter for screening mammogram for malignant neoplasm of breast: Secondary | ICD-10-CM | POA: Diagnosis not present

## 2016-09-21 ENCOUNTER — Ambulatory Visit: Payer: PPO | Admitting: Occupational Therapy

## 2016-09-21 DIAGNOSIS — M25621 Stiffness of right elbow, not elsewhere classified: Secondary | ICD-10-CM | POA: Diagnosis not present

## 2016-09-21 DIAGNOSIS — R200 Anesthesia of skin: Secondary | ICD-10-CM

## 2016-09-21 DIAGNOSIS — M25631 Stiffness of right wrist, not elsewhere classified: Secondary | ICD-10-CM

## 2016-09-21 DIAGNOSIS — R202 Paresthesia of skin: Secondary | ICD-10-CM

## 2016-09-21 DIAGNOSIS — M25641 Stiffness of right hand, not elsewhere classified: Secondary | ICD-10-CM

## 2016-09-21 DIAGNOSIS — M6281 Muscle weakness (generalized): Secondary | ICD-10-CM

## 2016-09-21 NOTE — Patient Instructions (Addendum)
Same HEP  

## 2016-09-21 NOTE — Therapy (Signed)
[place] [facility] [address], [place], [address] Phone: [phone]   Fax:  [phone]  Occupational Therapy Treatment  Patient Details  Name: Megan Hodge MRN: [id number] Date of Birth: 02/12/1946 Referring Provider: Celesta Aver  Encounter Date: 09/21/2016      OT End of Session - 09/21/16 1223    Visit Number 32   Number of Visits 40   Date for OT Re-Evaluation 10/19/16   OT Start Time 1116   OT Stop Time 1206   OT Time Calculation (min) 50 min   Activity Tolerance Patient tolerated treatment well   Behavior During Therapy [facility] for tasks assessed/performed      Past Medical History:  Diagnosis Date  . Abnormal mammogram, unspecified    right breast  . Cancer ([place]) 1968   cervical cancer  . Hypertension 1995    Past Surgical History:  Procedure Laterality Date  . ABDOMINAL HYSTERECTOMY  1993  . APPENDECTOMY  1993  . BREAST BIOPSY Right 1992  . BREAST BIOPSY Right March 2014   Atypical ductal hyperplasia. Failed trial of tamoxifen for chemoprevention.  . COLONOSCOPY  08/2014   Dr. Vira Agar  . DILATION AND CURETTAGE OF UTERUS  1968    There were no vitals filed for this visit.      Subjective Assessment - 09/21/16 1147    Subjective  My hand and arm feels so stiff - don't know if it is the cold weather and snow -    Patient Stated Goals Want to use my R dominant hand like prior to ROM - brushing teeth, eating , cutting food, phone , fixing hair,  bathing , dressing    Currently in Pain? No/denies            [facility] OT Assessment - 09/21/16 0001      AROM   Right Forearm Pronation 70 Degrees   Right Forearm Supination 80 Degrees   Right Wrist Extension 47 Degrees     Strength   Right Hand Grip (lbs) 15   Right Hand Lateral Pinch 8 lbs   Right Hand 3 Point Pinch 6 lbs   Left Hand Grip (lbs) 25   Left Hand Lateral Pinch 12 lbs   Left Hand 3 Point Pinch 9 lbs     Elbow flexion 130,  Extentoin -38 and PROM -34  At [facility]              OT Treatments/Exercises (OP) - 09/21/16 0001      RUE Paraffin   Number Minutes Paraffin 10 Minutes   Comments at [facility] to increase elbow flexion and hand flexion - with large heatinpad to increase elbow extention and fist        Measure ROM in wrist , elbow and grip - see flowsheet  Reassess use and fit of LANTZ splint for elbow flexion - pt was reporting she did not feel pull or stretch  Pt to work last 15 degrees - gave her visual marker     Paraffin done to hand to elbow - supine for elbow extention - large eatingpad for elbow and hand in fist 10 min   Graston tools for volar wrist , distal bicep and triceps - sweeping and scooping - nr 2 and 4 prior to PROM  For elbow flexion   Tight and trigger points to distal bicep and volar forearm and wrist  with pain   PROM and prolonged stretch for extention   contract  and relax for elbow extention 10 reps YTB for elbow extention - OT block shoulder extention to compensate - 10 reps  Mod V/c AROM pushing into wall with small ball behind upper arm and at wrist 10 reps   Elbow flexion at end -34 A and PROM -28            OT Education - 09/21/16 1222    Education Details worked on [employer] splint use for flexion and extention HEP for YTB and contract/relax into small ball   Person(s) Educated Patient   Methods Explanation;Demonstration;Tactile cues;Verbal cues   Comprehension Verbalized understanding;Returned demonstration;Verbal cues required          OT Short Term Goals - 09/21/16 1225      OT SHORT TERM GOAL #1   Title R hand digits flexion able to touch palm to hold toothbrush and  utencils   Baseline PRogressing - see flowsheet   Time 3   Period Weeks   Status On-going     OT SHORT TERM GOAL #2   Title Pt independing in HEP to decrease scar tissue, increase  digits and wrist AROM to turn doorknob, do puzzles, wash dishes   Status Achieved     OT  SHORT TERM GOAL #3   Title R wrist sup/pro and wrist extention improve with more than30 degrees to turn doorknob and pages, use sweeper duster   Baseline wrist extention improved to 45 - sup and pronation still impaired    Time 3   Period Weeks   Status On-going           OT Long Term Goals - 09/21/16 1226      OT LONG TERM GOAL #1   Title R elbow extention improve with more than 30 degrees  to  tie shoes and reach in pocket   Baseline can reach into pocket - extention walk in -38 and in session -34 - PROM -28    Time 4   Status On-going     OT LONG TERM GOAL #2   Title R elbow flexion improve with with more than 20 degrees to do hair, apply deodorant, do jewelry    Baseline R elbow this date 130    Time 4   Period Weeks   Status Achieved     OT LONG TERM GOAL #3   Title R grip strength improve for pt to have more than 50% grip compare to L hand    Baseline R 19 , L 31    Time 4   Period Weeks   Status Achieved     OT LONG TERM GOAL #4   Title Function on PREE improve more than 20 points    Baseline PREE function score 28/50 - need to be reassess next session    Time 4   Status On-going               Plan - 09/21/16 1224    Clinical Impression Statement Pt cont to make progress in wrist extention , digits flexion and elbow extention/flexion - but sup/pronation about the same and grip strength - pt still limiting by tightness/stfiffness in digits flexion - pt benefit and show progress with use of LANTZ splint at home and Graston tools with PROM at clinic    Rehab Potential Good   OT Frequency 2x / week   OT Duration 4 weeks   OT Treatment/Interventions Self-care/ADL training;Moist Heat;Fluidtherapy;Splinting;Patient/family education;Therapeutic exercises;Contrast Bath;Ultrasound;Scar mobilization;Passive range of motion;Parrafin;Manual Therapy   Plan cont  elbow and wrist extnetion - fistiing - Graston for soft tissue    OT Home Exercise Plan see pt instruction     Consulted and Agree with Plan of Care Patient      Patient will benefit from skilled therapeutic intervention in order to improve the following deficits and impairments:  Decreased range of motion, Impaired flexibility, Increased edema, Decreased knowledge of precautions, Impaired UE functional use, Pain, Decreased strength  Visit Diagnosis: Stiffness of right elbow, not elsewhere classified  Stiffness of right hand, not elsewhere classified  Stiffness of right wrist, not elsewhere classified  Numbness and tingling of hand  Muscle weakness (generalized)    Problem List Patient Active Problem List   Diagnosis Date Noted  . 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 Atypical ductal hyperplasia, breast 09/26/2012    Class: Diagnosis of    Rosalyn Gess OTR/L,CLT 09/21/2016, 12:28 PM  [place] [facility] [address], [place], [address] Phone: [phone]   Fax:  [phone]  Name: Megan Hodge MRN: [id number] Date of Birth: Nov 10, 1945

## 2016-09-22 ENCOUNTER — Encounter: Payer: Self-pay | Admitting: General Surgery

## 2016-09-23 ENCOUNTER — Ambulatory Visit: Payer: PPO | Admitting: Occupational Therapy

## 2016-09-23 DIAGNOSIS — R202 Paresthesia of skin: Secondary | ICD-10-CM

## 2016-09-23 DIAGNOSIS — M25641 Stiffness of right hand, not elsewhere classified: Secondary | ICD-10-CM

## 2016-09-23 DIAGNOSIS — M25631 Stiffness of right wrist, not elsewhere classified: Secondary | ICD-10-CM

## 2016-09-23 DIAGNOSIS — M6281 Muscle weakness (generalized): Secondary | ICD-10-CM

## 2016-09-23 DIAGNOSIS — R200 Anesthesia of skin: Secondary | ICD-10-CM

## 2016-09-23 DIAGNOSIS — M25621 Stiffness of right elbow, not elsewhere classified: Secondary | ICD-10-CM | POA: Diagnosis not present

## 2016-09-23 NOTE — Patient Instructions (Addendum)
Same HEP  

## 2016-09-23 NOTE — Therapy (Signed)
[place] [facility] [address], [place], [address] Phone: [phone]   Fax:  [phone]  Occupational Therapy Treatment  Patient Details  Name: Megan Hodge MRN: [id number] Date of Birth: 06-26-1946 Referring Provider: Celesta Aver  Encounter Date: 09/23/2016      OT End of Session - 09/23/16 1252    Visit Number 33   Number of Visits 40   Date for OT Re-Evaluation 10/19/16   OT Start Time 1201   OT Stop Time 1249   OT Time Calculation (min) 48 min   Activity Tolerance Patient tolerated treatment well   Behavior During Therapy [facility] for tasks assessed/performed      Past Medical History:  Diagnosis Date  . Abnormal mammogram, unspecified    right breast  . Cancer ([place]) 1968   cervical cancer  . Hypertension 1995    Past Surgical History:  Procedure Laterality Date  . ABDOMINAL HYSTERECTOMY  1993  . APPENDECTOMY  1993  . BREAST BIOPSY Right 1992  . BREAST BIOPSY Right March 2014   Atypical ductal hyperplasia. Failed trial of tamoxifen for chemoprevention.  . COLONOSCOPY  08/2014   Dr. Vira Agar  . DILATION AND CURETTAGE OF UTERUS  1968    There were no vitals filed for this visit.      Subjective Assessment - 09/23/16 1220    Subjective  Doing okay - using it more - hand and elbow just feels stiff- but could be cold weather   Patient Stated Goals Want to use my R dominant hand like prior to ROM - brushing teeth, eating , cutting food, phone , fixing hair,  bathing , dressing    Currently in Pain? No/denies                      OT Treatments/Exercises (OP) - 09/23/16 0001      RUE Paraffin   Number Minutes Paraffin 10 Minutes   Comments at [facility] to elbow and hand - with elbow in extention stretch with large heatingpad       Measure ROM in  elbow  Ext and flexion  AROM 130 and PROM 140 flexion  Elbow AROM -30 ext and -25   Paraffin done to hand to elbow - supine for elbow  extention - large heatingpad for elbow and hand in fist 10 min   Graston tools for volar wrist , distal bicep and triceps - sweeping and scooping - nr 2 and 4 prior to PROM  Tight and trigger points to distal bicep and volar forearm proximal and at wrist    PROM and prolonged stretch for digits flexion, wrist extention and elbow  extention  contract and relax for elbow extention 10 reps end range  Wrist extention prolonged stretch 3 x 30 sec BTE CPM for wrist extention 200sec  Wrist extention AROM 10 reps  1 lbs for wrist extention 10 reps  gripper at 40 lbs 120 sec             OT Education - 09/23/16 1252    Education provided Yes   Education Details Findings and cont with HEP    Person(s) Educated Patient   Methods Explanation;Demonstration;Tactile cues;Verbal cues   Comprehension Returned demonstration;Verbal cues required;Verbalized understanding          OT Short Term Goals - 09/21/16 1225      OT SHORT TERM GOAL #1   Title R hand digits flexion able to touch palm  to hold toothbrush and  utencils   Baseline PRogressing - see flowsheet   Time 3   Period Weeks   Status On-going     OT SHORT TERM GOAL #2   Title Pt independing in HEP to decrease scar tissue, increase  digits and wrist AROM to turn doorknob, do puzzles, wash dishes   Status Achieved     OT SHORT TERM GOAL #3   Title R wrist sup/pro and wrist extention improve with more than30 degrees to turn doorknob and pages, use sweeper duster   Baseline wrist extention improved to 45 - sup and pronation still impaired    Time 3   Period Weeks   Status On-going           OT Long Term Goals - 09/21/16 1226      OT LONG TERM GOAL #1   Title R elbow extention improve with more than 30 degrees  to  tie shoes and reach in pocket   Baseline can reach into pocket - extention walk in -38 and in session -34 - PROM -28    Time 4   Status On-going     OT LONG TERM GOAL #2   Title R elbow flexion improve  with with more than 20 degrees to do hair, apply deodorant, do jewelry    Baseline R elbow this date 130    Time 4   Period Weeks   Status Achieved     OT LONG TERM GOAL #3   Title R grip strength improve for pt to have more than 50% grip compare to L hand    Baseline R 19 , L 31    Time 4   Period Weeks   Status Achieved     OT LONG TERM GOAL #4   Title Function on PREE improve more than 20 points    Baseline PREE function score 28/50 - need to be reassess next session    Time 4   Status On-going               Plan - 09/23/16 1252    Clinical Impression Statement Pt cont to make progress but just slow - but steady - pt at last 30 degrees of flexion and extention of elbow - wrist extention and digits flexion impaired - will focus on soft tissue on volar wrist and forearm next week    Rehab Potential Good   OT Frequency 2x / week   OT Duration 4 weeks   OT Treatment/Interventions Self-care/ADL training;Moist Heat;Fluidtherapy;Splinting;Patient/family education;Therapeutic exercises;Contrast Bath;Ultrasound;Scar mobilization;Passive range of motion;Parrafin;Manual Therapy   Plan soft tissue on volar wrist and forearm - elbow ext, wrist ext, and digits flexion    OT Home Exercise Plan see pt instruction    Consulted and Agree with Plan of Care Patient      Patient will benefit from skilled therapeutic intervention in order to improve the following deficits and impairments:  Decreased range of motion, Impaired flexibility, Increased edema, Decreased knowledge of precautions, Impaired UE functional use, Pain, Decreased strength  Visit Diagnosis: Stiffness of right elbow, not elsewhere classified  Stiffness of right hand, not elsewhere classified  Stiffness of right wrist, not elsewhere classified  Numbness and tingling of hand  Muscle weakness (generalized)    Problem List Patient Active Problem List   Diagnosis Date Noted  . Atypical ductal hyperplasia of breast  09/25/2014  . Atypical ductal hyperplasia, breast 09/26/2012    Class: Diagnosis of    Trenell Moxey, Gwenette Greet  OTR/L,CLT 09/23/2016, 12:58 PM  [place] [facility] [address], [place], [address] Phone: [phone]   Fax:  [phone]  Name: Megan Hodge MRN: [id number] Date of Birth: 13-Mar-1946

## 2016-09-28 ENCOUNTER — Ambulatory Visit (INDEPENDENT_AMBULATORY_CARE_PROVIDER_SITE_OTHER): Payer: PPO | Admitting: General Surgery

## 2016-09-28 ENCOUNTER — Encounter: Payer: Self-pay | Admitting: General Surgery

## 2016-09-28 ENCOUNTER — Ambulatory Visit: Payer: PPO | Admitting: Occupational Therapy

## 2016-09-28 VITALS — BP 142/78 | HR 99 | Resp 14 | Ht 60.0 in | Wt 225.0 lb

## 2016-09-28 DIAGNOSIS — M25641 Stiffness of right hand, not elsewhere classified: Secondary | ICD-10-CM

## 2016-09-28 DIAGNOSIS — M25621 Stiffness of right elbow, not elsewhere classified: Secondary | ICD-10-CM

## 2016-09-28 DIAGNOSIS — M6281 Muscle weakness (generalized): Secondary | ICD-10-CM

## 2016-09-28 DIAGNOSIS — M25631 Stiffness of right wrist, not elsewhere classified: Secondary | ICD-10-CM

## 2016-09-28 DIAGNOSIS — N6091 Unspecified benign mammary dysplasia of right breast: Secondary | ICD-10-CM

## 2016-09-28 DIAGNOSIS — R202 Paresthesia of skin: Secondary | ICD-10-CM

## 2016-09-28 DIAGNOSIS — R200 Anesthesia of skin: Secondary | ICD-10-CM

## 2016-09-28 DIAGNOSIS — N6099 Unspecified benign mammary dysplasia of unspecified breast: Secondary | ICD-10-CM

## 2016-09-28 NOTE — Patient Instructions (Addendum)
Patient to follow up with her PCP for mammogram and self breast checks.

## 2016-09-28 NOTE — Therapy (Signed)
[place] [facility] [address] [place], [address] Phone: [phone]   Fax:  [phone]  Occupational Therapy Treatment  Patient Details  Name: Megan Hodge MRN: [id number] Date of Birth: Jun 13, 1946 Referring Provider: Celesta Aver  Encounter Date: 09/28/2016      OT End of Session - 09/28/16 1514    Visit Number 34   Number of Visits 40   Date for OT Re-Evaluation 10/19/16   OT Start Time 1105   OT Stop Time 1200   OT Time Calculation (min) 55 min   Activity Tolerance Patient tolerated treatment well   Behavior During Therapy [facility] for tasks assessed/performed      Past Medical History:  Diagnosis Date  . Abnormal mammogram, unspecified    right breast  . Cancer ([place]) 1968   cervical cancer  . Hypertension 1995    Past Surgical History:  Procedure Laterality Date  . ABDOMINAL HYSTERECTOMY  1993  . APPENDECTOMY  1993  . BREAST BIOPSY Right 1992  . BREAST BIOPSY Right March 2014   Atypical ductal hyperplasia. Failed trial of tamoxifen for chemoprevention.  . COLONOSCOPY  08/2014   Dr. Vira Agar  . DILATION AND CURETTAGE OF UTERUS  1968    There were no vitals filed for this visit.      Subjective Assessment - 09/28/16 1455    Subjective  USing it more - hand felt better after last time - elbow okay - getting there    Patient Stated Goals Want to use my R dominant hand like prior to ROM - brushing teeth, eating , cutting food, phone , fixing hair,  bathing , dressing    Currently in Pain? No/denies                      OT Treatments/Exercises (OP) - 09/28/16 0001      RUE Paraffin   Number Minutes Paraffin 10 Minutes   Comments at [facility] to increase ROM       Measure ROM in  elbow  Ext Elbow AROM -30 ext and -24 Assess fit of wrist splint - provided tubigrip to decrease sliding on isotoner glove    Paraffin done to hand to elbow - supine for elbow extention - large  heatingpad for elbow and hand in fist 10 min   Graston tools for volar wrist , distal bicep and triceps - sweeping and scooping - nr 2 and 4 prior to PROM  Tight and trigger points to distal bicep and volar forearm proximal and at wrist   PROM of digits -composite  each one separate PROM and hold flexion at [facility] - pt to use knuckle bender 3-5 min on prior to composite flexion PROM   PROM and prolonged  wrist extention and elbow  extention - 30 sec x 3  contract and relax for elbow extention 10 reps end range  Wrist extention prolonged stretch 3 x 30 sec BTE CPM for wrist extention 200sec x 2   Wrist extention AROM 10 reps    gripper at 40 lbs 120 sec x 2 on BTE           OT Education - 09/28/16 1513    Education provided Yes   Education Details HEP - use knucklebender for [facility] flexion    Person(s) Educated Patient   Methods Explanation;Demonstration;Tactile cues;Verbal cues   Comprehension Verbalized understanding;Returned demonstration;Verbal cues required          OT Short  Term Goals - 09/21/16 1225      OT SHORT TERM GOAL #1   Title R hand digits flexion able to touch palm to hold toothbrush and  utencils   Baseline PRogressing - see flowsheet   Time 3   Period Weeks   Status On-going     OT SHORT TERM GOAL #2   Title Pt independing in HEP to decrease scar tissue, increase  digits and wrist AROM to turn doorknob, do puzzles, wash dishes   Status Achieved     OT SHORT TERM GOAL #3   Title R wrist sup/pro and wrist extention improve with more than30 degrees to turn doorknob and pages, use sweeper duster   Baseline wrist extention improved to 45 - sup and pronation still impaired    Time 3   Period Weeks   Status On-going           OT Long Term Goals - 09/21/16 1226      OT LONG TERM GOAL #1   Title R elbow extention improve with more than 30 degrees  to  tie shoes and reach in pocket   Baseline can reach into pocket - extention walk in -38 and in  session -34 - PROM -28    Time 4   Status On-going     OT LONG TERM GOAL #2   Title R elbow flexion improve with with more than 20 degrees to do hair, apply deodorant, do jewelry    Baseline R elbow this date 130    Time 4   Period Weeks   Status Achieved     OT LONG TERM GOAL #3   Title R grip strength improve for pt to have more than 50% grip compare to L hand    Baseline R 19 , L 31    Time 4   Period Weeks   Status Achieved     OT LONG TERM GOAL #4   Title Function on PREE improve more than 20 points    Baseline PREE function score 28/50 - need to be reassess next session    Time 4   Status On-going               Plan - 09/28/16 1514    Clinical Impression Statement Pt cont to make progress in elbow extention and flexion -as well as use - pt to focus on MC flexion prior to composite - to use knucklebender for 3-5 min    Rehab Potential Good   OT Frequency 2x / week   OT Duration 4 weeks   OT Treatment/Interventions Self-care/ADL training;Moist Heat;Fluidtherapy;Splinting;Patient/family education;Therapeutic exercises;Contrast Bath;Ultrasound;Scar mobilization;Passive range of motion;Parrafin;Manual Therapy   Plan cont increae ROM for fisting, wrist and elbow extention    OT Home Exercise Plan see pt instruction    Consulted and Agree with Plan of Care Patient      Patient will benefit from skilled therapeutic intervention in order to improve the following deficits and impairments:  Decreased range of motion, Impaired flexibility, Increased edema, Decreased knowledge of precautions, Impaired UE functional use, Pain, Decreased strength  Visit Diagnosis: Stiffness of right elbow, not elsewhere classified  Stiffness of right hand, not elsewhere classified  Stiffness of right wrist, not elsewhere classified  Numbness and tingling of hand  Muscle weakness (generalized)    Problem List Patient Active Problem List   Diagnosis Date Noted  . Atypical ductal  hyperplasia of breast 09/25/2014  . Atypical ductal hyperplasia, breast 09/26/2012  Class: Diagnosis of    Rosalyn Gess OTR/L,CLT  09/28/2016, 3:17 PM  [place] [facility] [address] [place], [address] Phone: [phone]   Fax:  [phone]  Name: Megan Hodge MRN: [id number] Date of Birth: 12/26/1945

## 2016-09-28 NOTE — Progress Notes (Signed)
Patient ID: Megan Hodge, female   DOB: 07-10-1946, 72 y.o.   MRN: [id number]  Chief Complaint  Patient presents with  . Follow-up    mammogram     HPI Megan Hodge is a 71 y.o. female who presents for a breast evaluation. The most recent mammogram was done on 09/20/2016. Patient had right are surgery in October 2017.  Patient does perform regular self breast checks and gets regular mammograms done.    HPI  Past Medical History:  Diagnosis Date  . Abnormal mammogram, unspecified    right breast  . Cancer ([place]) 1968   cervical cancer  . Hypertension 1995    Past Surgical History:  Procedure Laterality Date  . ABDOMINAL HYSTERECTOMY  1993  . APPENDECTOMY  1993  . BREAST BIOPSY Right 1992  . BREAST BIOPSY Right March 2014   Atypical ductal hyperplasia. Failed trial of tamoxifen for chemoprevention.  . COLONOSCOPY  08/2014   Dr. Vira Agar  . DILATION AND CURETTAGE OF UTERUS  1968  . JOINT REPLACEMENT Right 04/2016    Family History  Problem Relation Age of Onset  . Cancer Mother 79    lung    Social History Social History  Substance Use Topics  . Smoking status: Never Smoker  . Smokeless tobacco: Never Used  . Alcohol use No    Allergies  Allergen Reactions  . Ivp Dye [Iodinated Diagnostic Agents]     Redness     Current Outpatient Prescriptions  Medication Sig Dispense Refill  . benazepril-hydrochlorthiazide (LOTENSIN HCT) 20-12.5 MG per tablet Take 1 tablet by mouth daily.    . hydrochlorothiazide (MICROZIDE) 12.5 MG capsule     . metoprolol succinate (TOPROL-XL) 25 MG 24 hr tablet     . Omeprazole (PRILOSEC PO) Take by mouth.    Alveda Reasons 20 MG TABS tablet      No current facility-administered medications for this visit.     Review of Systems Review of Systems  Constitutional: Negative.   Respiratory: Negative.   Cardiovascular: Negative.     Blood pressure (!) 142/78, pulse 99, resp. rate 14, height 5' (1.524 m), weight 225 lb (102.1  kg).  Physical Exam Physical Exam  Constitutional: She is oriented to person, place, and time. She appears well-developed and well-nourished.  Eyes: Conjunctivae are normal. No scleral icterus.  Neck: Neck supple.  Cardiovascular: Normal rate, regular rhythm and normal heart sounds.   Pulmonary/Chest: Effort normal and breath sounds normal. Right breast exhibits no inverted nipple, no mass, no nipple discharge, no skin change and no tenderness. Left breast exhibits no inverted nipple, no mass, no nipple discharge, no skin change and no tenderness.  Abdominal: Soft. Bowel sounds are normal. There is no tenderness.  Lymphadenopathy:    She has no cervical adenopathy.    She has no axillary adenopathy.  Neurological: She is alert and oriented to person, place, and time.  Skin: Skin is warm and dry.    Data Reviewed Any mammograms dated 09/20/2016 were reviewed. No interval change. BI-RADS-2. ([facility]).  Assessment    Unremarkable breast exam.    Plan    The patient was unable to tolerate estrogen suppression therapy as originally recommended with the identification of ADH. There's been no interval change in the 4 years post biopsy. In light of this I think she can return to routine follow-up with her PCP for annual screening mammograms and breast examination.  She's welcomed return at any time if new changes are identified either  on clinical exam or mammography.    Patient to follow up with her PCP for mammogram and self breast checks.  This information has been scribed by Gaspar Cola CMA.   Robert Bellow 09/29/2016, 8:32 PM

## 2016-09-28 NOTE — Patient Instructions (Addendum)
Use knuckle bender prior to composite fist

## 2016-10-01 ENCOUNTER — Ambulatory Visit: Payer: PPO | Admitting: Occupational Therapy

## 2016-10-01 DIAGNOSIS — R200 Anesthesia of skin: Secondary | ICD-10-CM

## 2016-10-01 DIAGNOSIS — R202 Paresthesia of skin: Secondary | ICD-10-CM

## 2016-10-01 DIAGNOSIS — M6281 Muscle weakness (generalized): Secondary | ICD-10-CM

## 2016-10-01 DIAGNOSIS — M25621 Stiffness of right elbow, not elsewhere classified: Secondary | ICD-10-CM | POA: Diagnosis not present

## 2016-10-01 DIAGNOSIS — M25641 Stiffness of right hand, not elsewhere classified: Secondary | ICD-10-CM

## 2016-10-01 DIAGNOSIS — M25631 Stiffness of right wrist, not elsewhere classified: Secondary | ICD-10-CM

## 2016-10-01 NOTE — Therapy (Signed)
[place] [facility] [address] [place], [address] Phone: [phone]   Fax:  [phone]  Occupational Therapy Treatment  Patient Details  Name: Megan Hodge MRN: [id number] Date of Birth: 1945/12/02 Referring Provider: Celesta Aver  Encounter Date: 10/01/2016      OT End of Session - 10/01/16 1226    Visit Number 35   Number of Visits 40   Date for OT Re-Evaluation 10/19/16   OT Start Time 4268   OT Stop Time 1304   OT Time Calculation (min) 60 min   Activity Tolerance Patient tolerated treatment well   Behavior During Therapy [facility] for tasks assessed/performed      Past Medical History:  Diagnosis Date  . Abnormal mammogram, unspecified    right breast  . Cancer ([place]) 1968   cervical cancer  . Hypertension 1995    Past Surgical History:  Procedure Laterality Date  . ABDOMINAL HYSTERECTOMY  1993  . APPENDECTOMY  1993  . BREAST BIOPSY Right 1992  . BREAST BIOPSY Right March 2014   Atypical ductal hyperplasia. Failed trial of tamoxifen for chemoprevention.  . COLONOSCOPY  08/2014   Dr. Vira Agar  . DILATION AND CURETTAGE OF UTERUS  1968  . JOINT REPLACEMENT Right 04/2016    There were no vitals filed for this visit.      Subjective Assessment - 10/01/16 1224    Subjective  Hand was not as stiff this am - using compression sock over glove - splint staying more in place - do hear clicking with 2 lbs    Patient Stated Goals Want to use my R dominant hand like prior to ROM - brushing teeth, eating , cutting food, phone , fixing hair,  bathing , dressing    Currently in Pain? No/denies                      OT Treatments/Exercises (OP) - 10/01/16 0001      RUE Paraffin   Number Minutes Paraffin 10 Minutes   Comments at [facility] to increase elbow extnetion and fist - cobanf flexion wrap       Paraffin done to hand to elbow - supine for elbow extention - large heatingpad for elbow and  hand in fist 10 min   Graston tools for volar wrist , distal bicep and triceps - sweeping and scooping - nr 2 and 4 prior to PROM  Tight and trigger points to distal bicep and volar forearm proximal and at wrist   PROM of digits -composite  each one separate PROM and hold flexion at [facility] - pt to use knuckle bender 3-5 min on prior to composite flexion PROM   PROM and prolonged  wrist extention and elbow extention - 30 sec x 3  contract and relax for elbow extention 10 reps end range  Elbow extention against wall - facing wall - arm to side and palm on table  Wrist extention prolonged stretch 3 x 30 sec BTE CPM for wrist extention 200sec x 2  Wrist extention AROM 10 reps   gripper at 40 lbs 120 sec x 1 on BTE  And 3 point pinch 120 sec at 20 lbs             OT Education - 10/01/16 1226    Education provided Yes   Education Details HEP update - LANTZ splint   Person(s) Educated Patient   Methods Explanation;Tactile cues;Demonstration   Comprehension Returned demonstration;Verbalized  understanding          OT Short Term Goals - 09/21/16 1225      OT SHORT TERM GOAL #1   Title R hand digits flexion able to touch palm to hold toothbrush and  utencils   Baseline PRogressing - see flowsheet   Time 3   Period Weeks   Status On-going     OT SHORT TERM GOAL #2   Title Pt independing in HEP to decrease scar tissue, increase  digits and wrist AROM to turn doorknob, do puzzles, wash dishes   Status Achieved     OT SHORT TERM GOAL #3   Title R wrist sup/pro and wrist extention improve with more than30 degrees to turn doorknob and pages, use sweeper duster   Baseline wrist extention improved to 45 - sup and pronation still impaired    Time 3   Period Weeks   Status On-going           OT Long Term Goals - 09/21/16 1226      OT LONG TERM GOAL #1   Title R elbow extention improve with more than 30 degrees  to  tie shoes and reach in pocket   Baseline can  reach into pocket - extention walk in -38 and in session -34 - PROM -28    Time 4   Status On-going     OT LONG TERM GOAL #2   Title R elbow flexion improve with with more than 20 degrees to do hair, apply deodorant, do jewelry    Baseline R elbow this date 130    Time 4   Period Weeks   Status Achieved     OT LONG TERM GOAL #3   Title R grip strength improve for pt to have more than 50% grip compare to L hand    Baseline R 19 , L 31    Time 4   Period Weeks   Status Achieved     OT LONG TERM GOAL #4   Title Function on PREE improve more than 20 points    Baseline PREE function score 28/50 - need to be reassess next session    Time 4   Status On-going               Plan - 10/01/16 1306    Clinical Impression Statement Pt report decrease edema and stiffness in digits with fisting  with glove and tubigrip - pt to cont with increase ROM elbow and wrist exetnion- and digits flexion    Rehab Potential Good   OT Frequency 2x / week   OT Duration 4 weeks   OT Treatment/Interventions Self-care/ADL training;Moist Heat;Fluidtherapy;Splinting;Patient/family education;Therapeutic exercises;Contrast Bath;Ultrasound;Scar mobilization;Passive range of motion;Parrafin;Manual Therapy   Plan assess grip , elbow at [facility] - fisting    OT Home Exercise Plan see pt instruction    Consulted and Agree with Plan of Care Patient      Patient will benefit from skilled therapeutic intervention in order to improve the following deficits and impairments:  Decreased range of motion, Impaired flexibility, Increased edema, Decreased knowledge of precautions, Impaired UE functional use, Pain, Decreased strength  Visit Diagnosis: Stiffness of right hand, not elsewhere classified  Stiffness of right wrist, not elsewhere classified  Numbness and tingling of hand  Muscle weakness (generalized)    Problem List Patient Active Problem List   Diagnosis Date Noted  . Atypical ductal hyperplasia of  breast 09/25/2014  . Atypical ductal hyperplasia, breast 09/26/2012  Class: Diagnosis of    Rosalyn Gess OTR/L,CLT 10/01/2016, 1:07 PM  [place] [facility] [address] [place], [address] Phone: [phone]   Fax:  [phone]  Name: Megan Hodge MRN: [id number] Date of Birth: 07-14-1945

## 2016-10-01 NOTE — Patient Instructions (Addendum)
  Pt to do knucklebender for MC flexion  Pinch and lat pinch in putty and foam block  Same as before

## 2016-10-05 ENCOUNTER — Ambulatory Visit: Payer: PPO | Admitting: Occupational Therapy

## 2016-10-05 DIAGNOSIS — M25621 Stiffness of right elbow, not elsewhere classified: Secondary | ICD-10-CM | POA: Diagnosis not present

## 2016-10-05 DIAGNOSIS — M25641 Stiffness of right hand, not elsewhere classified: Secondary | ICD-10-CM

## 2016-10-05 DIAGNOSIS — M6281 Muscle weakness (generalized): Secondary | ICD-10-CM

## 2016-10-05 DIAGNOSIS — R200 Anesthesia of skin: Secondary | ICD-10-CM

## 2016-10-05 DIAGNOSIS — R202 Paresthesia of skin: Secondary | ICD-10-CM

## 2016-10-05 DIAGNOSIS — M25631 Stiffness of right wrist, not elsewhere classified: Secondary | ICD-10-CM

## 2016-10-05 NOTE — Patient Instructions (Addendum)
  Putty upgrade to teal for lat and 3 point - 20 reps each for at home  2x day  Also for gripping teal putty 15 reps     RTB for elbow and shoulder extention 12 reps each  Scapula retraction, elbow extention holding with L at chest And elbow flexion 12 reps  Each for HEP

## 2016-10-05 NOTE — Therapy (Signed)
[place] [facility] [address], [place], [address] Phone: [phone]   Fax:  [phone]  Occupational Therapy Treatment  Patient Details  Name: Megan Hodge MRN: [id number] Date of Birth: Oct 28, 1945 Referring Provider: Celesta Aver  Encounter Date: 10/05/2016      OT End of Session - 10/05/16 1206    Visit Number 36   Number of Visits 40   Date for OT Re-Evaluation 10/19/16   OT Start Time 1140   OT Stop Time 1240   OT Time Calculation (min) 60 min   Activity Tolerance Patient tolerated treatment well   Behavior During Therapy [facility] for tasks assessed/performed      Past Medical History:  Diagnosis Date  . Abnormal mammogram, unspecified    right breast  . Cancer ([place]) 1968   cervical cancer  . Hypertension 1995    Past Surgical History:  Procedure Laterality Date  . ABDOMINAL HYSTERECTOMY  1993  . APPENDECTOMY  1993  . BREAST BIOPSY Right 1992  . BREAST BIOPSY Right March 2014   Atypical ductal hyperplasia. Failed trial of tamoxifen for chemoprevention.  . COLONOSCOPY  08/2014   Dr. Vira Agar  . DILATION AND CURETTAGE OF UTERUS  1968  . JOINT REPLACEMENT Right 04/2016    There were no vitals filed for this visit.      Subjective Assessment - 10/05/16 1202    Subjective  My arm was hurting so bad this weekend - but don't know if it was what we did Friday or the weather - it did snow on Sat and was cold   Patient Stated Goals Want to use my R dominant hand like prior to ROM - brushing teeth, eating , cutting food, phone , fixing hair,  bathing , dressing    Currently in Pain? No/denies            [facility] OT Assessment - 10/05/16 0001      AROM   Right Elbow Flexion 135  140 PROM SOC   Right Elbow Extension -30  -24 PROM  SOC   Right Wrist Extension 47 Degrees     Strength   Right Hand Grip (lbs) 15   Right Hand Lateral Pinch 8 lbs   Right Hand 3 Point Pinch 6 lbs   Left Hand Grip  (lbs) 30   Left Hand Lateral Pinch 12 lbs   Left Hand 3 Point Pinch 9 lbs                  OT Treatments/Exercises (OP) - 10/05/16 0001      RUE Paraffin   Number Minutes Paraffin 10 Minutes   Comments at [facility] flexion coban wrap to digits  flexion - and large weight on elbow extention -        MEasure ROM at wrist , elbow and grip/prehension - see flowsheet  Putty upgrade to teal for lat and 3 point - 20 reps each for at home  2x day  Also for gripping teal putty 15 reps   Paraffin done to hand to elbow - supine for elbow extention - large heatingpad for elbow and hand in fist 10 min   Graston tools for volar wrist , distal bicep and triceps with stretch for wrist and elbow extention - sweeping and scooping - nr 2 and 4 prior to PROM  Tight volar wrist   PROM of digits -composite each one separate   PROM and prolonged wrist extention and elbow  extention - 30 sec x 3  contract and relax for elbow extention 10 reps end range  Elbow extention against wall - facing wall - arm to side and palm on table -  AAROM and PROM for elbow into wall - into extention  Wrist extention prolonged stretch 3 x 30 sec Wrist extention 3 lbs blocking shoulder extention against wall  RTB for elbow and shoulder extention 12 reps each  BTE CPM for wrist extention 200sec x 1  Wrist extention AROM 10 reps   gripper at 40 lbs 120 sec x 1 on BTE              OT Education - 10/05/16 1206    Education provided Yes   Education Details upgrade putty  to teal and theraband to red,    Person(s) Educated Patient   Methods Explanation;Demonstration;Verbal cues;Tactile cues   Comprehension Verbalized understanding;Verbal cues required;Returned demonstration          OT Short Term Goals - 09/21/16 1225      OT SHORT TERM GOAL #1   Title R hand digits flexion able to touch palm to hold toothbrush and  utencils   Baseline PRogressing - see flowsheet   Time 3   Period Weeks    Status On-going     OT SHORT TERM GOAL #2   Title Pt independing in HEP to decrease scar tissue, increase  digits and wrist AROM to turn doorknob, do puzzles, wash dishes   Status Achieved     OT SHORT TERM GOAL #3   Title R wrist sup/pro and wrist extention improve with more than30 degrees to turn doorknob and pages, use sweeper duster   Baseline wrist extention improved to 45 - sup and pronation still impaired    Time 3   Period Weeks   Status On-going           OT Long Term Goals - 09/21/16 1226      OT LONG TERM GOAL #1   Title R elbow extention improve with more than 30 degrees  to  tie shoes and reach in pocket   Baseline can reach into pocket - extention walk in -38 and in session -34 - PROM -28    Time 4   Status On-going     OT LONG TERM GOAL #2   Title R elbow flexion improve with with more than 20 degrees to do hair, apply deodorant, do jewelry    Baseline R elbow this date 130    Time 4   Period Weeks   Status Achieved     OT LONG TERM GOAL #3   Title R grip strength improve for pt to have more than 50% grip compare to L hand    Baseline R 19 , L 31    Time 4   Period Weeks   Status Achieved     OT LONG TERM GOAL #4   Title Function on PREE improve more than 20 points    Baseline PREE function score 28/50 - need to be reassess next session    Time 4   Status On-going               Plan - 10/05/16 1206    Clinical Impression Statement Pt's elbow flexion great progress - able to touch ear with palm - still tight in volar extensors, fisting and elbow extention - slow but steady progress - did increase resistance for putty, RTB  and 3 lbs weigth in  clinic    Rehab Potential Good   OT Frequency 2x / week   OT Duration 4 weeks   OT Treatment/Interventions Self-care/ADL training;Moist Heat;Fluidtherapy;Splinting;Patient/family education;Therapeutic exercises;Contrast Bath;Ultrasound;Scar mobilization;Passive range of motion;Parrafin;Manual Therapy    Plan assess digits flexion    OT Home Exercise Plan see pt instruction    Consulted and Agree with Plan of Care Patient      Patient will benefit from skilled therapeutic intervention in order to improve the following deficits and impairments:  Decreased range of motion, Impaired flexibility, Increased edema, Decreased knowledge of precautions, Impaired UE functional use, Pain, Decreased strength  Visit Diagnosis: Stiffness of right hand, not elsewhere classified  Stiffness of right wrist, not elsewhere classified  Numbness and tingling of hand  Muscle weakness (generalized)  Stiffness of right elbow, not elsewhere classified    Problem List Patient Active Problem List   Diagnosis Date Noted  . 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 Atypical ductal hyperplasia, breast 09/26/2012    Class: Diagnosis of    Rosalyn Gess OTR/L,CLT 10/05/2016, 12:50 PM  [place] [facility] [address], [place], [address] Phone: [phone]   Fax:  [phone]  Name: Megan Hodge MRN: [id number] Date of Birth: May 16, 1946

## 2016-10-07 ENCOUNTER — Ambulatory Visit: Payer: PPO | Admitting: Occupational Therapy

## 2016-10-07 DIAGNOSIS — R202 Paresthesia of skin: Secondary | ICD-10-CM

## 2016-10-07 DIAGNOSIS — M25621 Stiffness of right elbow, not elsewhere classified: Secondary | ICD-10-CM

## 2016-10-07 DIAGNOSIS — M6281 Muscle weakness (generalized): Secondary | ICD-10-CM

## 2016-10-07 DIAGNOSIS — M25641 Stiffness of right hand, not elsewhere classified: Secondary | ICD-10-CM

## 2016-10-07 DIAGNOSIS — M25631 Stiffness of right wrist, not elsewhere classified: Secondary | ICD-10-CM

## 2016-10-07 DIAGNOSIS — R200 Anesthesia of skin: Secondary | ICD-10-CM

## 2016-10-07 NOTE — Therapy (Signed)
[place] [facility] [address] [place], [address] Phone: [phone]   Fax:  [phone]  Occupational Therapy Treatment  Patient Details  Name: Megan Hodge MRN: [id number] Date of Birth: 1945-11-21 Referring Provider: Celesta Aver  Encounter Date: 10/07/2016      OT End of Session - 10/07/16 1303    Visit Number 37   Number of Visits 40   Date for OT Re-Evaluation 10/19/16   OT Start Time 1115   OT Stop Time 1205   OT Time Calculation (min) 50 min   Activity Tolerance Patient tolerated treatment well   Behavior During Therapy [facility] for tasks assessed/performed      Past Medical History:  Diagnosis Date  . Abnormal mammogram, unspecified    right breast  . Cancer ([place]) 1968   cervical cancer  . Hypertension 1995    Past Surgical History:  Procedure Laterality Date  . ABDOMINAL HYSTERECTOMY  1993  . APPENDECTOMY  1993  . BREAST BIOPSY Right 1992  . BREAST BIOPSY Right March 2014   Atypical ductal hyperplasia. Failed trial of tamoxifen for chemoprevention.  . COLONOSCOPY  08/2014   Dr. Vira Agar  . DILATION AND CURETTAGE OF UTERUS  1968  . JOINT REPLACEMENT Right 04/2016    There were no vitals filed for this visit.      Subjective Assessment - 10/07/16 1254    Subjective  Appt next week with Dr - doing okay - fingers stiff - was not as sore after last time    Patient Stated Goals Want to use my R dominant hand like prior to ROM - brushing teeth, eating , cutting food, phone , fixing hair,  bathing , dressing    Currently in Pain? No/denies            [facility] OT Assessment - 10/07/16 0001      Right Hand AROM   R Index  MCP 0-90 75 Degrees   R Index PIP 0-100 90 Degrees   R Long  MCP 0-90 80 Degrees   R Long PIP 0-100 95 Degrees   R Ring  MCP 0-90 80 Degrees   R Ring PIP 0-100 90 Degrees   R Little  MCP 0-90 80 Degrees   R Little PIP 0-100 90 Degrees                  OT  Treatments/Exercises (OP) - 10/07/16 0001      RUE Paraffin   Number Minutes Paraffin 10 Minutes   Comments --  at [facility] to increase elbow extention and fingers flexion-      Measure digits flexion - no progress in MC's and PIP's  Pt to do prolonged flexion stretch - composite at home - done in clinic this date 3 min  Followed by AROM into 1 cm pink foam block  And 1/2 of teal putty in cylinder and work end range for gripping  Pt made progress -add change to HEP   Paraffin done to hand to elbow - supine for elbow extention - large heatingpad for elbow with 3 lbs weight over forerarm to increase extention stretch  and hand in fist 10 min   Graston tools for volar wrist , distal bicep and triceps with stretch for wrist and elbow extention - sweeping and scooping - nr 2 and 4 prior to PROM  Tight volar wrist    PROM and prolonged wrist extention and elbow extention - 30 sec x  5 contract and relax for elbow extention 10 reps end range  Elbow extention against wall - facing wall -  Shoulder stabilized and palm on wall - 8 cm foam block at volar elbow - pt pushing and hold end range extention -add to HEP - 4 x 15 sec  AAROM and PROM for elbow extention  into wall - into extention   RTB for elbow and shoulder extention 12 reps each Place and hold end range elbow extention - with RTB 3 x 12 sec   Also RTB holding double with L and push into extnetion with R - 10 reps  Pt to do xtra exention session - and only 2 flexion  With LANTZ splint   Extention -27 and PROM -22             OT Education - 10/07/16 1302    Education provided Yes   Education Details change HEP for fexion of diigs - and add some elbow extention HEP    Person(s) Educated Patient   Methods Explanation;Demonstration;Tactile cues;Verbal cues;Handout   Comprehension Verbal cues required;Returned demonstration;Verbalized understanding          OT Short Term Goals - 09/21/16 1225      OT SHORT TERM  GOAL #1   Title R hand digits flexion able to touch palm to hold toothbrush and  utencils   Baseline PRogressing - see flowsheet   Time 3   Period Weeks   Status On-going     OT SHORT TERM GOAL #2   Title Pt independing in HEP to decrease scar tissue, increase  digits and wrist AROM to turn doorknob, do puzzles, wash dishes   Status Achieved     OT SHORT TERM GOAL #3   Title R wrist sup/pro and wrist extention improve with more than30 degrees to turn doorknob and pages, use sweeper duster   Baseline wrist extention improved to 45 - sup and pronation still impaired    Time 3   Period Weeks   Status On-going           OT Long Term Goals - 09/21/16 1226      OT LONG TERM GOAL #1   Title R elbow extention improve with more than 30 degrees  to  tie shoes and reach in pocket   Baseline can reach into pocket - extention walk in -38 and in session -34 - PROM -28    Time 4   Status On-going     OT LONG TERM GOAL #2   Title R elbow flexion improve with with more than 20 degrees to do hair, apply deodorant, do jewelry    Baseline R elbow this date 130    Time 4   Period Weeks   Status Achieved     OT LONG TERM GOAL #3   Title R grip strength improve for pt to have more than 50% grip compare to L hand    Baseline R 19 , L 31    Time 4   Period Weeks   Status Achieved     OT LONG TERM GOAL #4   Title Function on PREE improve more than 20 points    Baseline PREE function score 28/50 - need to be reassess next session    Time 4   Status On-going               Plan - 10/07/16 1309    Clinical Impression Statement Pt making progress in elbow extention and flexion -  wrist extention tight- did now show increase flexion of digits - but after changing this date HEP fo flexion did show progress in clinic     Rehab Potential Good   OT Frequency 2x / week   OT Duration 4 weeks   OT Treatment/Interventions Self-care/ADL training;Moist Heat;Fluidtherapy;Splinting;Patient/family  education;Therapeutic exercises;Contrast Bath;Ultrasound;Scar mobilization;Passive range of motion;Parrafin;Manual Therapy   Plan assess progress and do PREE   OT Home Exercise Plan see pt instruction    Consulted and Agree with Plan of Care Patient      Patient will benefit from skilled therapeutic intervention in order to improve the following deficits and impairments:  Decreased range of motion, Impaired flexibility, Increased edema, Decreased knowledge of precautions, Impaired UE functional use, Pain, Decreased strength  Visit Diagnosis: Stiffness of right hand, not elsewhere classified  Stiffness of right wrist, not elsewhere classified  Numbness and tingling of hand  Muscle weakness (generalized)  Stiffness of right elbow, not elsewhere classified    Problem List Patient Active Problem List   Diagnosis Date Noted  . 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 Atypical ductal hyperplasia, breast 09/26/2012    Class: Diagnosis of    Rosalyn Gess OTR/L,CLT 10/07/2016, 1:10 PM  [place] [facility] [address] [place], [address] Phone: [phone]   Fax:  [phone]  Name: Megan Hodge MRN: [id number] Date of Birth: 1946-06-13

## 2016-10-07 NOTE — Patient Instructions (Signed)
  Pt to do prolonged flexion stretch - composite at home - 3 min -5 min severa times a day Followed by AROM into 1 cm pink foam block  And 1/2 of teal putty in cylinder and work end range for gripping      Elbow extention against wall - facing wall -  Shoulder stabilized and palm on wall - 8 cm foam block at volar elbow - pt pushing and hold end range extention -add to HEP - 4 x 15 sec  RTB for elbow and shoulder extention 12 reps each Place and hold end range elbow extention - with RTB 3 x 12 sec   Also RTB holding double with L and push into extnetion with R - 10 reps  Pt to do xtra exention session - and only 2 flexion  With LANTZ splint   Extention -27 and PROM -22

## 2016-10-12 ENCOUNTER — Ambulatory Visit: Payer: PPO | Attending: Student | Admitting: Occupational Therapy

## 2016-10-12 DIAGNOSIS — R200 Anesthesia of skin: Secondary | ICD-10-CM | POA: Diagnosis not present

## 2016-10-12 DIAGNOSIS — R202 Paresthesia of skin: Secondary | ICD-10-CM | POA: Diagnosis not present

## 2016-10-12 DIAGNOSIS — M6281 Muscle weakness (generalized): Secondary | ICD-10-CM | POA: Diagnosis not present

## 2016-10-12 DIAGNOSIS — M25631 Stiffness of right wrist, not elsewhere classified: Secondary | ICD-10-CM | POA: Insufficient documentation

## 2016-10-12 DIAGNOSIS — M25641 Stiffness of right hand, not elsewhere classified: Secondary | ICD-10-CM | POA: Diagnosis not present

## 2016-10-12 DIAGNOSIS — M25621 Stiffness of right elbow, not elsewhere classified: Secondary | ICD-10-CM | POA: Insufficient documentation

## 2016-10-12 NOTE — Patient Instructions (Signed)
Add to HEP   Composite PROM for digits Putty end range  Prehension strength putty  Add pulling and twisting putty - ( using all digits - with sup/pro) Teal  Can do sup with teal putty  Cont with LANTZ splint  RTB for elbow extention - double fold 4 lbs for elbow flexion, ext, ext rotation  12 reps  SUp/pro 4 lbs - can support for few days

## 2016-10-12 NOTE — Therapy (Signed)
[place] [facility] [address] [place], [address] Phone: [phone]   Fax:  [phone]  Occupational Therapy Treatment  Patient Details  Name: Megan Hodge MRN: [id number] Date of Birth: 1945-09-04 Referring Provider: Celesta Aver  Encounter Date: 10/12/2016      OT End of Session - 10/12/16 1230    Visit Number 38   Number of Visits 40   Date for OT Re-Evaluation 10/19/16   OT Start Time 1115   OT Stop Time 1211   OT Time Calculation (min) 56 min   Activity Tolerance Patient tolerated treatment well   Behavior During Therapy [facility] for tasks assessed/performed      Past Medical History:  Diagnosis Date  . Abnormal mammogram, unspecified    right breast  . Cancer ([place]) 1968   cervical cancer  . Hypertension 1995    Past Surgical History:  Procedure Laterality Date  . ABDOMINAL HYSTERECTOMY  1993  . APPENDECTOMY  1993  . BREAST BIOPSY Right 1992  . BREAST BIOPSY Right March 2014   Atypical ductal hyperplasia. Failed trial of tamoxifen for chemoprevention.  . COLONOSCOPY  08/2014   Dr. Vira Agar  . DILATION AND CURETTAGE OF UTERUS  1968  . JOINT REPLACEMENT Right 04/2016    There were no vitals filed for this visit.      Subjective Assessment - 10/12/16 1225    Subjective  appt with MD tomorrow -using it more - can tell it is getting better - only time hurting is with rain coming in - did work my fingers this am - still some numbness in pinkie   Patient Stated Goals Want to use my R dominant hand like prior to ROM - brushing teeth, eating , cutting food, phone , fixing hair,  bathing , dressing    Currently in Pain? No/denies            [facility] OT Assessment - 10/12/16 0001      AROM   Right Elbow Flexion 137   Right Elbow Extension -30  PROM -24   Right Wrist Extension 47 Degrees     Strength   Right Hand Grip (lbs) 19   Right Hand Lateral Pinch 9 lbs   Right Hand 3 Point Pinch 6 lbs      Right Hand AROM   R Index  MCP 0-90 75 Degrees   R Index PIP 0-100 90 Degrees   R Long  MCP 0-90 80 Degrees   R Long PIP 0-100 95 Degrees   R Ring  MCP 0-90 85 Degrees   R Ring PIP 0-100 95 Degrees   R Little  MCP 0-90 85 Degrees   R Little PIP 0-100 100 Degrees    ROM assess at all joints Grip and prehension assess PREE done - 11/50; function 16.5/50 Progress discuss, and update HEP    Add to HEP   Composite PROM for digits Putty end range  Prehension strength putty  Add pulling and twisting putty - ( using all digits - with sup/pro) Teal  Can do sup with teal putty  Cont with LANTZ splint  RTB for elbow extention - double fold 4 lbs for elbow flexion, ext, ext rotation  12 reps  SUp/pro 4 lbs - can support for few days               OT Treatments/Exercises (OP) - 10/12/16 0001      RUE Paraffin   Number Minutes Paraffin  10 Minutes   Comments --  hand prior to PROM and composite flexion of digits  to incre                OT Education - 10/12/16 1230    Education provided Yes   Education Details updated and add more putty and 4 lbs weight for elbow   Person(s) Educated Patient   Methods Explanation;Demonstration;Tactile cues;Verbal cues;Handout   Comprehension Verbalized understanding;Returned demonstration;Verbal cues required          OT Short Term Goals - 10/12/16 1232      OT SHORT TERM GOAL #1   Title R hand digits flexion able to touch palm to hold toothbrush and  utencils   Baseline Progress- not touching palm yet -    Time 3   Period Weeks   Status On-going     OT SHORT TERM GOAL #2   Title Pt independing in HEP to decrease scar tissue, increase  digits and wrist AROM to turn doorknob, do puzzles, wash dishes   Status Achieved     OT SHORT TERM GOAL #3   Title R wrist sup/pro and wrist extention improve with more than30 degrees to turn doorknob and pages, use sweeper duster   Baseline progress- cannot turn doorknob yet    Time 3   Period Weeks   Status On-going           OT Long Term Goals - 10/12/16 1234      OT LONG TERM GOAL #1   Title R elbow extention improve with more than 30 degrees  to  tie shoes and reach in pocket   Baseline extention -30 , PROM -24   Status Achieved     OT LONG TERM GOAL #2   Title R elbow flexion improve with with more than 20 degrees to do hair, apply deodorant, do jewelry    Status Achieved     OT LONG TERM GOAL #3   Title R grip strength improve for pt to have more than 50% grip compare to L hand    Baseline R 19 , L 31 lbs   Status Achieved     OT LONG TERM GOAL #4   Title Function on PREE improve more than 20 points    Baseline PREE function score 28/50 - 16.5/50 now   Time 3   Period Weeks   Status On-going               Plan - 10/12/16 1231    Clinical Impression Statement Pt made progress since last week in digits flexion and grip- pt to cont to increase ROM in digits ,forearm and elbow - as well strength - update HEP - decrease pain to 1 x wk - pt to see surgeon tomorrow    Rehab Potential Good   OT Frequency 1x / week   OT Duration 6 weeks   OT Treatment/Interventions Self-care/ADL training;Moist Heat;Fluidtherapy;Splinting;Patient/family education;Therapeutic exercises;Contrast Bath;Ultrasound;Scar mobilization;Passive range of motion;Parrafin;Manual Therapy   Plan results from MD visit   OT Home Exercise Plan see pt instruction    Consulted and Agree with Plan of Care Patient      Patient will benefit from skilled therapeutic intervention in order to improve the following deficits and impairments:  Decreased range of motion, Impaired flexibility, Increased edema, Decreased knowledge of precautions, Impaired UE functional use, Pain, Decreased strength  Visit Diagnosis: Stiffness of right hand, not elsewhere classified  Stiffness of right wrist, not elsewhere classified  Numbness  and tingling of hand  Muscle weakness  (generalized)  Stiffness of right elbow, not elsewhere classified    Problem List Patient Active Problem List   Diagnosis Date Noted  . 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 Atypical ductal hyperplasia, breast 09/26/2012    Class: Diagnosis of    Rosalyn Gess OTR/L, CLT 10/12/2016, 12:47 PM  [place] [facility] [address] [place], [address] Phone: [phone]   Fax:  [phone]  Name: SKYLEN DANIELSEN MRN: [id number] Date of Birth: July 02, 1946

## 2016-10-13 DIAGNOSIS — Y33XXXD Other specified events, undetermined intent, subsequent encounter: Secondary | ICD-10-CM | POA: Diagnosis not present

## 2016-10-13 DIAGNOSIS — S52001D Unspecified fracture of upper end of right ulna, subsequent encounter for closed fracture with routine healing: Secondary | ICD-10-CM | POA: Diagnosis not present

## 2016-10-13 DIAGNOSIS — S52101D Unspecified fracture of upper end of right radius, subsequent encounter for closed fracture with routine healing: Secondary | ICD-10-CM | POA: Diagnosis not present

## 2016-10-19 ENCOUNTER — Ambulatory Visit: Payer: PPO | Admitting: Occupational Therapy

## 2016-10-19 DIAGNOSIS — R200 Anesthesia of skin: Secondary | ICD-10-CM

## 2016-10-19 DIAGNOSIS — M25621 Stiffness of right elbow, not elsewhere classified: Secondary | ICD-10-CM

## 2016-10-19 DIAGNOSIS — M6281 Muscle weakness (generalized): Secondary | ICD-10-CM

## 2016-10-19 DIAGNOSIS — M25631 Stiffness of right wrist, not elsewhere classified: Secondary | ICD-10-CM

## 2016-10-19 DIAGNOSIS — M25641 Stiffness of right hand, not elsewhere classified: Secondary | ICD-10-CM

## 2016-10-19 DIAGNOSIS — R202 Paresthesia of skin: Secondary | ICD-10-CM

## 2016-10-19 NOTE — Patient Instructions (Addendum)
  Composite PROM for digits - reviewed again with pt  Place and hold -review with pt again  Green foam block 2 cm - end range in palm  COnt with teal putty for HEP   Cont with LANTZ splint

## 2016-10-19 NOTE — Therapy (Signed)
[place] [facility] [address] [place], [address] Phone: [phone]   Fax:  [phone]  Occupational Therapy Treatment  Patient Details  Name: Megan Hodge MRN: [id number] Date of Birth: 08-23-1945 Referring Provider: Celesta Aver  Encounter Date: 10/19/2016      OT End of Session - 10/19/16 1234    Visit Number 39   Number of Visits 40   Date for OT Re-Evaluation 10/19/16   OT Start Time 1208   OT Stop Time 1258   OT Time Calculation (min) 50 min   Activity Tolerance Patient tolerated treatment well   Behavior During Therapy [facility] for tasks assessed/performed      Past Medical History:  Diagnosis Date  . Abnormal mammogram, unspecified    right breast  . Cancer ([place]) 1968   cervical cancer  . Hypertension 1995    Past Surgical History:  Procedure Laterality Date  . ABDOMINAL HYSTERECTOMY  1993  . APPENDECTOMY  1993  . BREAST BIOPSY Right 1992  . BREAST BIOPSY Right March 2014   Atypical ductal hyperplasia. Failed trial of tamoxifen for chemoprevention.  . COLONOSCOPY  08/2014   Dr. Vira Agar  . DILATION AND CURETTAGE OF UTERUS  1968  . JOINT REPLACEMENT Right 04/2016    There were no vitals filed for this visit.      Subjective Assessment - 10/19/16 1231    Subjective  Seen Dr - said my whole elbow was made over - numbness was for 2 days not in finger - so making progress , and some days my hands better than other days- not as puffy - and can made better fist    Patient Stated Goals Want to use my R dominant hand like prior to ROM - brushing teeth, eating , cutting food, phone , fixing hair,  bathing , dressing    Currently in Pain? No/denies            [facility] OT Assessment - 10/19/16 0001      AROM   Right Elbow Flexion 137   Right Elbow Extension -28                  OT Treatments/Exercises (OP) - 10/19/16 0001      RUE Paraffin   Number Minutes Paraffin 10 Minutes   Comments --  At [facility] to increase ROM at elbow and digits      Elbow flexion and extention - 137 flexion , extention -30  Paraffin to hand and elbow prior to ROM  graston tools for volar wrist and hand - webspace of thumb prior to ROM  nr 2 tool sweeping and brushing   PROM for wrist composite extention  10 reps   Composite PROM for digits - reviewed again with pt  Place and hold -review with pt again  Green foam block 2 cm - end range in palm  COnt with teal putty for HEP   Cont with LANTZ splint  BTE for gripper 40 lbs 120 sec  Lat grip 26 lbs 120 sec  3 point grip on BTE 26 lbs - 120 sec              OT Education - 10/19/16 1233    Education provided Yes   Education Details HEP what to focus on   Person(s) Educated Patient   Methods Explanation;Demonstration;Tactile cues;Verbal cues   Comprehension Verbal cues required;Returned demonstration;Verbalized understanding          OT  Short Term Goals - 10/12/16 1232      OT SHORT TERM GOAL #1   Title R hand digits flexion able to touch palm to hold toothbrush and  utencils   Baseline Progress- not touching palm yet -    Time 3   Period Weeks   Status On-going     OT SHORT TERM GOAL #2   Title Pt independing in HEP to decrease scar tissue, increase  digits and wrist AROM to turn doorknob, do puzzles, wash dishes   Status Achieved     OT SHORT TERM GOAL #3   Title R wrist sup/pro and wrist extention improve with more than30 degrees to turn doorknob and pages, use sweeper duster   Baseline progress- cannot turn doorknob yet   Time 3   Period Weeks   Status On-going           OT Long Term Goals - 10/12/16 1234      OT LONG TERM GOAL #1   Title R elbow extention improve with more than 30 degrees  to  tie shoes and reach in pocket   Baseline extention -30 , PROM -24   Status Achieved     OT LONG TERM GOAL #2   Title R elbow flexion improve with with more than 20 degrees to do hair, apply deodorant, do  jewelry    Status Achieved     OT LONG TERM GOAL #3   Title R grip strength improve for pt to have more than 50% grip compare to L hand    Baseline R 19 , L 31 lbs   Status Achieved     OT LONG TERM GOAL #4   Title Function on PREE improve more than 20 points    Baseline PREE function score 28/50 - 16.5/50 now   Time 3   Period Weeks   Status On-going               Plan - 10/19/16 1234    Clinical Impression Statement Pt seen surgeon last week - pt progressing slow and steady - pt to return to MD in June again - focus on PROM digits flexion , wrist extention , elbow extnetion - and strengthening - pt to cont wih HEP - decrease to 1 x wk and then biweekly    Rehab Potential Good   OT Frequency 1x / week   OT Duration 2 weeks   OT Treatment/Interventions Self-care/ADL training;Moist Heat;Fluidtherapy;Splinting;Patient/family education;Therapeutic exercises;Contrast Bath;Ultrasound;Scar mobilization;Passive range of motion;Parrafin;Manual Therapy   Plan hand gripper for use at home - did order   OT Home Exercise Plan see pt instruction    Consulted and Agree with Plan of Care Patient      Patient will benefit from skilled therapeutic intervention in order to improve the following deficits and impairments:  Decreased range of motion, Impaired flexibility, Increased edema, Decreased knowledge of precautions, Impaired UE functional use, Pain, Decreased strength  Visit Diagnosis: Stiffness of right hand, not elsewhere classified  Stiffness of right wrist, not elsewhere classified  Numbness and tingling of hand  Muscle weakness (generalized)  Stiffness of right elbow, not elsewhere classified    Problem List Patient Active Problem List   Diagnosis Date Noted  . 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 Atypical ductal hyperplasia, breast 09/26/2012    Class: Diagnosis of    Rosalyn Gess OTR/L,CLT 10/19/2016, 4:16 PM  [place] [facility] [address]  [place], [place], [address] Phone: [phone]   Fax:  [phone]  Name: Megan Hodge MRN: [id number] Date of Birth: [birth year]

## 2016-10-26 ENCOUNTER — Encounter: Payer: PPO | Admitting: Occupational Therapy

## 2016-10-28 ENCOUNTER — Ambulatory Visit: Payer: PPO | Admitting: Occupational Therapy

## 2016-10-28 DIAGNOSIS — M6281 Muscle weakness (generalized): Secondary | ICD-10-CM

## 2016-10-28 DIAGNOSIS — M25621 Stiffness of right elbow, not elsewhere classified: Secondary | ICD-10-CM

## 2016-10-28 DIAGNOSIS — R202 Paresthesia of skin: Secondary | ICD-10-CM

## 2016-10-28 DIAGNOSIS — M25631 Stiffness of right wrist, not elsewhere classified: Secondary | ICD-10-CM

## 2016-10-28 DIAGNOSIS — M25641 Stiffness of right hand, not elsewhere classified: Secondary | ICD-10-CM

## 2016-10-28 DIAGNOSIS — R200 Anesthesia of skin: Secondary | ICD-10-CM

## 2016-10-28 NOTE — Therapy (Signed)
[place] [facility] [address] [place], [address] Phone: [phone]   Fax:  [phone]  Occupational Therapy Treatment  Patient Details  Name: Megan Hodge MRN: [id number] Date of Birth: 01/05/1946 Referring Provider: Celesta Aver  Encounter Date: 10/28/2016      OT End of Session - 10/28/16 2016    Visit Number 40   Number of Visits 40   Date for OT Re-Evaluation 10/28/16   OT Start Time 7322   OT Stop Time 1445   OT Time Calculation (min) 50 min   Activity Tolerance Patient tolerated treatment well   Behavior During Therapy [facility] for tasks assessed/performed      Past Medical History:  Diagnosis Date  . Abnormal mammogram, unspecified    right breast  . Cancer ([place]) 1968   cervical cancer  . Hypertension 1995    Past Surgical History:  Procedure Laterality Date  . ABDOMINAL HYSTERECTOMY  1993  . APPENDECTOMY  1993  . BREAST BIOPSY Right 1992  . BREAST BIOPSY Right March 2014   Atypical ductal hyperplasia. Failed trial of tamoxifen for chemoprevention.  . COLONOSCOPY  08/2014   Dr. Vira Agar  . DILATION AND CURETTAGE OF UTERUS  1968  . JOINT REPLACEMENT Right 04/2016    There were no vitals filed for this visit.      Subjective Assessment - 10/28/16 2010    Subjective  I did not do to good this week with using my splint - it feels like it is hurting more after I use it and pain/some numbness in my hand    Patient Stated Goals Want to use my R dominant hand like prior to ROM - brushing teeth, eating , cutting food, phone , fixing hair,  bathing , dressing    Currently in Pain? No/denies            [facility] OT Assessment - 10/28/16 0001      AROM   Right Elbow Flexion 135   Right Elbow Extension -34     Strength   Right Hand Grip (lbs) 20   Right Hand Lateral Pinch 9 lbs                  OT Treatments/Exercises (OP) - 10/28/16 0001      RUE Paraffin   Number Minutes  Paraffin 10 Minutes   Comments at [facility] to increase ROM at hand , wrist and elbow - prior to soft tissue and ROM     Elbow flexion and extention - 135 flexion , extention 34 Digits flexion assess and wrist extention  Grip and prehension - see flowsheet PROM for composite flexion of digits - able to touch palm and do place and hold for 3rd thru 5th   Paraffin to hand and elbow prior to ROM  graston tools for distal bicep/  volar wrist/forearm and hand - webspace of thumb prior to ROM  nr 2 tool sweeping and brushing   PROM for wrist composite extention  10 reps  Table slides for pt to do some weight bearing  Prayer stretch -10 reps   Composite PROM for digits - reviewed again with pt  Place and hold  Hand gripper setup - 2 yellow -  Adding to 10 lbs  10 reps  10 reps hold 10 sec  Add to HEP - 2 x day - can increase sets every 48hrs   Cont with LANTZ splint and bring in next time  OT Education - 10/28/16 2015    Education provided Yes   Education Details HEP hand gripper added   Person(s) Educated Patient   Methods Explanation;Demonstration;Tactile cues;Verbal cues   Comprehension Verbal cues required;Returned demonstration;Verbalized understanding          OT Short Term Goals - 10/12/16 1232      OT SHORT TERM GOAL #1   Title R hand digits flexion able to touch palm to hold toothbrush and  utencils   Baseline Progress- not touching palm yet -    Time 3   Period Weeks   Status On-going     OT SHORT TERM GOAL #2   Title Pt independing in HEP to decrease scar tissue, increase  digits and wrist AROM to turn doorknob, do puzzles, wash dishes   Status Achieved     OT SHORT TERM GOAL #3   Title R wrist sup/pro and wrist extention improve with more than30 degrees to turn doorknob and pages, use sweeper duster   Baseline progress- cannot turn doorknob yet   Time 3   Period Weeks   Status On-going           OT Long Term Goals - 10/12/16 1234       OT LONG TERM GOAL #1   Title R elbow extention improve with more than 30 degrees  to  tie shoes and reach in pocket   Baseline extention -30 , PROM -24   Status Achieved     OT LONG TERM GOAL #2   Title R elbow flexion improve with with more than 20 degrees to do hair, apply deodorant, do jewelry    Status Achieved     OT LONG TERM GOAL #3   Title R grip strength improve for pt to have more than 50% grip compare to L hand    Baseline R 19 , L 31 lbs   Status Achieved     OT LONG TERM GOAL #4   Title Function on PREE improve more than 20 points    Baseline PREE function score 28/50 - 16.5/50 now   Time 3   Period Weeks   Status On-going               Plan - 10/28/16 2017    Clinical Impression Statement Pt showing increase PROM and place and hold in fisting - pt did not use LANTZ splint as much this past week because of some pain - numbness - pt to bring in next time - to work on elbow and wrist extention- digits flexion and gripper    Rehab Potential Good   OT Frequency 1x / week   OT Duration 2 weeks   OT Treatment/Interventions Self-care/ADL training;Moist Heat;Fluidtherapy;Splinting;Patient/family education;Therapeutic exercises;Contrast Bath;Ultrasound;Scar mobilization;Passive range of motion;Parrafin;Manual Therapy   Plan assess improvement in grip and digits flexion - assess LANTZ splint fit    OT Home Exercise Plan see pt instruction    Consulted and Agree with Plan of Care Patient      Patient will benefit from skilled therapeutic intervention in order to improve the following deficits and impairments:  Decreased range of motion, Impaired flexibility, Increased edema, Decreased knowledge of precautions, Impaired UE functional use, Pain, Decreased strength  Visit Diagnosis: Stiffness of right hand, not elsewhere classified  Stiffness of right wrist, not elsewhere classified  Numbness and tingling of hand  Muscle weakness (generalized)  Stiffness of  right elbow, not elsewhere classified    Problem List Patient Active Problem List  Diagnosis Date Noted  . 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 Atypical ductal hyperplasia, breast 09/26/2012    Class: Diagnosis of    Rosalyn Gess OTR/L,CLT 10/28/2016, 8:19 PM  [place] [facility] [address], [place], [address] Phone: [phone]   Fax:  [phone]  Name: AARIYAH SAMPEY MRN: [id number] Date of Birth: 11-26-1945

## 2016-10-28 NOTE — Patient Instructions (Signed)
add to HEP  Hand gripper setup - 2 yellow -  Adding to 10 lbs  10 reps  10 reps hold 10 sec  Add to HEP - 2 x day - can increase sets every 48hrs   Cont with LANTZ splint and bring in next time

## 2016-11-08 ENCOUNTER — Ambulatory Visit: Payer: PPO | Admitting: Occupational Therapy

## 2016-11-08 DIAGNOSIS — M25631 Stiffness of right wrist, not elsewhere classified: Secondary | ICD-10-CM

## 2016-11-08 DIAGNOSIS — M25621 Stiffness of right elbow, not elsewhere classified: Secondary | ICD-10-CM

## 2016-11-08 DIAGNOSIS — M6281 Muscle weakness (generalized): Secondary | ICD-10-CM

## 2016-11-08 DIAGNOSIS — R202 Paresthesia of skin: Secondary | ICD-10-CM

## 2016-11-08 DIAGNOSIS — M25641 Stiffness of right hand, not elsewhere classified: Secondary | ICD-10-CM | POA: Diagnosis not present

## 2016-11-08 DIAGNOSIS — R200 Anesthesia of skin: Secondary | ICD-10-CM

## 2016-11-08 NOTE — Therapy (Signed)
[place] [facility] [address], [place], [address] Phone: [phone]   Fax:  [phone]  Occupational Therapy Treatment  Patient Details  Name: Megan Hodge MRN: [id number] Date of Birth: 07/28/1945 Referring Provider: Celesta Aver  Encounter Date: 11/08/2016      OT End of Session - 11/08/16 1929    Visit Number 41   Number of Visits 48   Date for OT Re-Evaluation 01/03/17   OT Start Time 1356   OT Stop Time 1443   OT Time Calculation (min) 47 min   Activity Tolerance Patient tolerated treatment well   Behavior During Therapy [facility] for tasks assessed/performed      Past Medical History:  Diagnosis Date  . Abnormal mammogram, unspecified    right breast  . Cancer ([place]) 1968   cervical cancer  . Hypertension 1995    Past Surgical History:  Procedure Laterality Date  . ABDOMINAL HYSTERECTOMY  1993  . APPENDECTOMY  1993  . BREAST BIOPSY Right 1992  . BREAST BIOPSY Right March 2014   Atypical ductal hyperplasia. Failed trial of tamoxifen for chemoprevention.  . COLONOSCOPY  08/2014   Dr. Vira Agar  . DILATION AND CURETTAGE OF UTERUS  1968  . JOINT REPLACEMENT Right 04/2016    There were no vitals filed for this visit.      Subjective Assessment - 11/08/16 1256    Subjective  Numbness is better - now just in the tip of pinkie- still weak - had hard time moving the rubber bands up 5 lbs- if you can check finally can turn doorknob ,    Patient Stated Goals Want to use my R dominant hand like prior to ROM - brushing teeth, eating , cutting food, phone , fixing hair,  bathing , dressing             [facility] OT Assessment - 11/08/16 0001      Assessment   Diagnosis ``````     AROM   Right Elbow Extension -28     Strength   Right Hand Lateral Pinch 9 lbs   Right Hand 3 Point Pinch 7 lbs                  OT Treatments/Exercises (OP) - 11/08/16 0001      RUE Paraffin   Number Minutes  Paraffin 10 Minutes   Comments hand and forearm to decrease tighteness , increaes ROM      MEasurements taken for prehension and elbow ROM , digits - MC's about 75 still ,wrist extention 42,  Elbow extention -28 and PROM -18    Pt to cont with LANTZ splint for elbow extention  Upgrade to green band for shoulder and elbow ext  10 reps and increase sets  reviewed in clinic Decrease her to yellow for double loop  yellow band from red - elbow extention  Report increase pain with Red   Paraffin done  Graston tools for brushing and sweeping nr 2 tool  Prior to , prayer stretch for wrist extention  MC flexion PROM  10- reps  10 lbs on griipper for place and hold - increase to actively or 2-3 sets  Putty to cont with - teal putty             OT Education - 11/08/16 1929    Education provided Yes   Education Details HEP changes    Person(s) Educated Patient   Methods Explanation;Demonstration;Tactile cues;Verbal cues  Comprehension Verbal cues required;Returned demonstration;Verbalized understanding          OT Short Term Goals - 11/08/16 1935      OT SHORT TERM GOAL #1   Title R hand digits flexion able to touch palm to hold toothbrush and  utencils   Baseline Progress-  touching palm   - but MC's about 75 degrees flexion    Time 3   Period Weeks   Status On-going     OT SHORT TERM GOAL #2   Title Pt independing in HEP to decrease scar tissue, increase  digits and wrist AROM to turn doorknob, do puzzles, wash dishes   Status Achieved     OT SHORT TERM GOAL #3   Title R wrist sup/pro and wrist extention improve with more than30 degrees to turn doorknob and pages, use sweeper duster   Status Achieved           OT Long Term Goals - 11/08/16 1936      OT LONG TERM GOAL #1   Title R elbow extention improve with more than 30 degrees  to  tie shoes and reach in pocket   Status Achieved     OT LONG TERM GOAL #2   Title R elbow flexion improve with with more than  20 degrees to do hair, apply deodorant, do jewelry    Status Achieved     OT LONG TERM GOAL #3   Title R grip strength improve for pt to have more than 50% grip compare to L hand    Baseline R 20, L 31   Status Achieved     OT LONG TERM GOAL #4   Title Function on PREE improve more than 20 points    Baseline 13/50 function on PREE    Time 3   Period Weeks   Status On-going               Plan - 11/08/16 1933    Clinical Impression Statement Pt making slow but steady progress in strength , functional use - and elbow extention with use of LANTZ splint - had to change HEP for PROM for MC flexion that was same , wrist extention , - upgrade hand gripper for place and hold - but putty same - increase use - pt  seen every about 10 days now - cont to increase ROM , strength and use or R domnant hand    Rehab Potential Good   OT Frequency 1x / week   OT Duration 8 weeks   OT Treatment/Interventions Self-care/ADL training;Moist Heat;Fluidtherapy;Splinting;Patient/family education;Therapeutic exercises;Contrast Bath;Ultrasound;Scar mobilization;Passive range of motion;Parrafin;Manual Therapy   Plan assess progress and upgrade HEP    OT Home Exercise Plan see pt instruction    Consulted and Agree with Plan of Care Patient      Patient will benefit from skilled therapeutic intervention in order to improve the following deficits and impairments:  Decreased range of motion, Impaired flexibility, Increased edema, Decreased knowledge of precautions, Impaired UE functional use, Pain, Decreased strength  Visit Diagnosis: Stiffness of right hand, not elsewhere classified  Stiffness of right wrist, not elsewhere classified  Numbness and tingling of hand  Muscle weakness (generalized)  Stiffness of right elbow, not elsewhere classified    Problem List Patient Active Problem List   Diagnosis Date Noted  . 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 Atypical ductal hyperplasia,  breast 09/26/2012    Class: Diagnosis of    Rosalyn Gess OTR/L,CLT 11/08/2016, 7:39  PM  [place] [facility] [address] [place], [address] Phone: [phone]   Fax:  [phone]  Name: NIKKO QUAST MRN: [id number] Date of Birth: 21-Apr-1946

## 2016-11-08 NOTE — Patient Instructions (Signed)
Pt to cont with LANTZ splint for elbow extention  Upgrade to green band for shoulder and elbow ext  10 reps and increase sets  Back up to yellow band for double up - elbow extention  Report increase pain with Red   Heat , prayer stretch for wrist extention  MC flexion PROM  10- reps  10 lbs on griipper for place and hold - increase to actively or 2-3 sets  Putty to cont with - teal putty

## 2016-11-18 ENCOUNTER — Ambulatory Visit: Payer: PPO | Attending: Student | Admitting: Occupational Therapy

## 2016-11-18 DIAGNOSIS — M25621 Stiffness of right elbow, not elsewhere classified: Secondary | ICD-10-CM

## 2016-11-18 DIAGNOSIS — M25631 Stiffness of right wrist, not elsewhere classified: Secondary | ICD-10-CM

## 2016-11-18 DIAGNOSIS — R202 Paresthesia of skin: Secondary | ICD-10-CM | POA: Insufficient documentation

## 2016-11-18 DIAGNOSIS — M6281 Muscle weakness (generalized): Secondary | ICD-10-CM | POA: Diagnosis not present

## 2016-11-18 DIAGNOSIS — M25641 Stiffness of right hand, not elsewhere classified: Secondary | ICD-10-CM | POA: Insufficient documentation

## 2016-11-18 DIAGNOSIS — R200 Anesthesia of skin: Secondary | ICD-10-CM | POA: Insufficient documentation

## 2016-11-18 NOTE — Therapy (Signed)
[place] [facility] [address] [place], [address] Phone: [phone]   Fax:  [phone]  Occupational Therapy Treatment  Patient Details  Name: Megan Hodge MRN: [id number] Date of Birth: 1946/06/19 Referring Provider: Celesta Aver  Encounter Date: 11/18/2016      OT End of Session - 11/18/16 1707    Visit Number 42   Number of Visits 13   Date for OT Re-Evaluation 01/03/17   OT Start Time 1357   OT Stop Time 1445   OT Time Calculation (min) 48 min   Activity Tolerance Patient tolerated treatment well   Behavior During Therapy [facility] for tasks assessed/performed      Past Medical History:  Diagnosis Date  . Abnormal mammogram, unspecified    right breast  . Cancer ([place]) 1968   cervical cancer  . Hypertension 1995    Past Surgical History:  Procedure Laterality Date  . ABDOMINAL HYSTERECTOMY  1993  . APPENDECTOMY  1993  . BREAST BIOPSY Right 1992  . BREAST BIOPSY Right March 2014   Atypical ductal hyperplasia. Failed trial of tamoxifen for chemoprevention.  . COLONOSCOPY  08/2014   Dr. Vira Agar  . DILATION AND CURETTAGE OF UTERUS  1968  . JOINT REPLACEMENT Right 04/2016    There were no vitals filed for this visit.      Subjective Assessment - 11/18/16 1543    Subjective  I am using my arm more - and can tell difference - but I am so tired from doing exercises - and feels like it is not getting better - like the gripper, and rotation of my forearm, and able to make tight fist   Patient Stated Goals Want to use my R dominant hand like prior to ROM - brushing teeth, eating , cutting food, phone , fixing hair,  bathing , dressing    Currently in Pain? No/denies            [facility] OT Assessment - 11/18/16 0001      AROM   Right Elbow Extension -26   Right Wrist Extension 50 Degrees     Strength   Right Hand Grip (lbs) 20   Right Hand Lateral Pinch 9 lbs   Right Hand 3 Point Pinch 7 lbs        Measurements taken for wrist ext, elbow ext, gripper and prehension - see flowsheet   Review with pt how much she can carry - about 4 lbs in R - able to do few reps of elbow flexion, sup/pronation partially  able to carry 10 lbs with now issues -using bilateral hands Some pain with 5 lbs  In R hand alone Pt to work on increase reps and sets with 4 lbs gradually Putty increase to green - review gripping , pulling and twisting(sup/pro); lat and 3 point grip  12 reps  2 sets a day  Cont with PROM for fisting prior to putty Cont like she done every other night wrist splint  Cont with LANTZ splint for elbow extention - 2-3 x day                       OT Education - 11/18/16 1707    Education provided Yes   Education Details HEP updated    Person(s) Educated Patient   Methods Explanation;Demonstration;Tactile cues;Handout;Verbal cues   Comprehension Verbal cues required;Returned demonstration;Verbalized understanding          OT Short Term Goals -  11/08/16 1935      OT SHORT TERM GOAL #1   Title R hand digits flexion able to touch palm to hold toothbrush and  utencils   Baseline Progress-  touching palm   - but MC's about 75 degrees flexion    Time 3   Period Weeks   Status On-going     OT SHORT TERM GOAL #2   Title Pt independing in HEP to decrease scar tissue, increase  digits and wrist AROM to turn doorknob, do puzzles, wash dishes   Status Achieved     OT SHORT TERM GOAL #3   Title R wrist sup/pro and wrist extention improve with more than30 degrees to turn doorknob and pages, use sweeper duster   Status Achieved           OT Long Term Goals - 11/08/16 1936      OT LONG TERM GOAL #1   Title R elbow extention improve with more than 30 degrees  to  tie shoes and reach in pocket   Status Achieved     OT LONG TERM GOAL #2   Title R elbow flexion improve with with more than 20 degrees to do hair, apply deodorant, do jewelry    Status Achieved      OT LONG TERM GOAL #3   Title R grip strength improve for pt to have more than 50% grip compare to L hand    Baseline R 20, L 31   Status Achieved     OT LONG TERM GOAL #4   Title Function on PREE improve more than 20 points    Baseline 13/50 function on PREE    Time 3   Period Weeks   Status On-going               Plan - 11/18/16 1708    Clinical Impression Statement Pt did make some progress in functional use , elbow and wrist extention - but grip and prehension same , flexion of digits and sup/pro - pt this date emotional about doing exercises now for 7 months -and do not see great progress- discuss that progress is smaller and more functional now - and did change HEP to less but most  important ones where she is still making progress and will for few months - pt follow up with surgeon is next month    Rehab Potential Good   OT Frequency 1x / week   OT Duration 4 weeks   OT Treatment/Interventions Self-care/ADL training;Moist Heat;Fluidtherapy;Splinting;Patient/family education;Therapeutic exercises;Contrast Bath;Ultrasound;Scar mobilization;Passive range of motion;Parrafin;Manual Therapy   Plan assess progress with upgrade of putty and 4 lbs weight    OT Home Exercise Plan see pt instruction    Consulted and Agree with Plan of Care Patient      Patient will benefit from skilled therapeutic intervention in order to improve the following deficits and impairments:  Decreased range of motion, Impaired flexibility, Increased edema, Decreased knowledge of precautions, Impaired UE functional use, Pain, Decreased strength  Visit Diagnosis: Stiffness of right hand, not elsewhere classified  Stiffness of right wrist, not elsewhere classified  Numbness and tingling of hand  Muscle weakness (generalized)  Stiffness of right elbow, not elsewhere classified    Problem List Patient Active Problem List   Diagnosis Date Noted  . Atypical ductal hyperplasia of breast 09/25/2014   . Atypical ductal hyperplasia, breast 09/26/2012    Class: Diagnosis of    Rosalyn Gess OTR/L,CLT 11/18/2016, 5:11 PM  [place]  [place] [facility] [address] [place], [address] Phone: [phone]   Fax:  [phone]  Name: Megan Hodge MRN: [id number] Date of Birth: 09-Apr-1946

## 2016-11-18 NOTE — Patient Instructions (Signed)
Pt to work on increase reps and sets with 4 lbs gradually Putty increase to green - review gripping , pulling and twisting(sup/pro); lat and 3 point grip  12 reps  2 sets a day  Cont with PROM for fisting prior to putty Cont like she done every other night wrist splint  Cont with LANTZ splint for elbow extention - 2-3 x day

## 2016-12-02 ENCOUNTER — Ambulatory Visit: Payer: PPO | Admitting: Occupational Therapy

## 2016-12-02 DIAGNOSIS — R202 Paresthesia of skin: Secondary | ICD-10-CM

## 2016-12-02 DIAGNOSIS — M25631 Stiffness of right wrist, not elsewhere classified: Secondary | ICD-10-CM

## 2016-12-02 DIAGNOSIS — M6281 Muscle weakness (generalized): Secondary | ICD-10-CM

## 2016-12-02 DIAGNOSIS — M25621 Stiffness of right elbow, not elsewhere classified: Secondary | ICD-10-CM

## 2016-12-02 DIAGNOSIS — M25641 Stiffness of right hand, not elsewhere classified: Secondary | ICD-10-CM | POA: Diagnosis not present

## 2016-12-02 DIAGNOSIS — R200 Anesthesia of skin: Secondary | ICD-10-CM

## 2016-12-02 NOTE — Therapy (Signed)
[place] [facility] [address], [place], [address] Phone: [phone]   Fax:  [phone]  Occupational Therapy Treatment  Patient Details  Name: Megan Hodge MRN: [id number] Date of Birth: [birth year] Referring Provider: Celesta Aver  Encounter Date: 12/02/2016      OT End of Session - 12/02/16 1306    Visit Number 43   Number of Visits 59   Date for OT Re-Evaluation 01/03/17   OT Start Time 1200   OT Stop Time 1238   OT Time Calculation (min) 38 min   Activity Tolerance Patient tolerated treatment well   Behavior During Therapy [facility] for tasks assessed/performed      Past Medical History:  Diagnosis Date  . Abnormal mammogram, unspecified    right breast  . Cancer ([place]) 1968   cervical cancer  . Hypertension 1995    Past Surgical History:  Procedure Laterality Date  . ABDOMINAL HYSTERECTOMY  1993  . APPENDECTOMY  1993  . BREAST BIOPSY Right 1992  . BREAST BIOPSY Right March 2014   Atypical ductal hyperplasia. Failed trial of tamoxifen for chemoprevention.  . COLONOSCOPY  08/2014   Dr. Vira Agar  . DILATION AND CURETTAGE OF UTERUS  1968  . JOINT REPLACEMENT Right 04/2016    There were no vitals filed for this visit.      Subjective Assessment - 12/02/16 1305    Subjective  I done my exercises - elbow splint , fingers ,putty, 4 lbs weight if I walk past it - using it more - but my husband allow me just some stuff   Patient Stated Goals Want to use my R dominant hand like prior to ROM - brushing teeth, eating , cutting food, phone , fixing hair,  bathing , dressing    Currently in Pain? No/denies            [facility] OT Assessment - 12/02/16 0001      AROM   Right Elbow Extension -24   Right Wrist Extension 50 Degrees     Strength   Right Hand Grip (lbs) 20   Right Hand Lateral Pinch 10 lbs   Right Hand 3 Point Pinch 7 lbs   Left Hand Grip (lbs) 31   Left Hand Lateral Pinch 14 lbs   Left  Hand 3 Point Pinch 9 lbs     Right Hand AROM   R Index  MCP 0-90 75 Degrees   R Index PIP 0-100 95 Degrees   R Long  MCP 0-90 85 Degrees   R Long PIP 0-100 97 Degrees   R Ring  MCP 0-90 85 Degrees   R Ring PIP 0-100 97 Degrees   R Little PIP 0-100 85 Degrees   R Little DIP 0-70 100 Degrees      Assess AROM for elbow and  wrist extention, digits flexion - and grip /prehension strength See flowsheet  Discuss functional use - trouble with picking up objects in kitchen that is heavy , vacuuming , cutting with knife, open jars  Assess carrying of lbs - pain with 6 lbs - can do 4 lbs comfortable - 5 some discomfort  Putty feels like it is  making her hand more puffy- pt to hold off on putty  Pt to only work on LANTZ splint for elbow extention ,PROM for wrist - and digits flexion PROM  Do some elbow , shoulder exercises with 4 lbs and do functional strength - using hand with objects  less than 5 lbs  Numbness only in DIP of 5th digit now                      OT Education - 12/02/16 1306    Education provided Yes   Education Details LANTZ splint , PROM to digits and wrist extention - functional strengthening    Person(s) Educated Patient   Methods Explanation;Demonstration;Tactile cues;Verbal cues   Comprehension Verbal cues required;Returned demonstration;Verbalized understanding          OT Short Term Goals - 11/08/16 1935      OT SHORT TERM GOAL #1   Title R hand digits flexion able to touch palm to hold toothbrush and  utencils   Baseline Progress-  touching palm   - but MC's about 75 degrees flexion    Time 3   Period Weeks   Status On-going     OT SHORT TERM GOAL #2   Title Pt independing in HEP to decrease scar tissue, increase  digits and wrist AROM to turn doorknob, do puzzles, wash dishes   Status Achieved     OT SHORT TERM GOAL #3   Title R wrist sup/pro and wrist extention improve with more than30 degrees to turn doorknob and pages, use sweeper  duster   Status Achieved           OT Long Term Goals - 11/08/16 1936      OT LONG TERM GOAL #1   Title R elbow extention improve with more than 30 degrees  to  tie shoes and reach in pocket   Status Achieved     OT LONG TERM GOAL #2   Title R elbow flexion improve with with more than 20 degrees to do hair, apply deodorant, do jewelry    Status Achieved     OT LONG TERM GOAL #3   Title R grip strength improve for pt to have more than 50% grip compare to L hand    Baseline R 20, L 31   Status Achieved     OT LONG TERM GOAL #4   Title Function on PREE improve more than 20 points    Baseline 13/50 function on PREE    Time 3   Period Weeks   Status On-going               Plan - 12/02/16 1307    Clinical Impression Statement Pt was seen about 2 wks ago - pt showed some progress in couple of digits flexion AROM , elbow extention , lat grip - pt to work  next 3 wks on functional strength - PROM for digits flexion and wrist extention - and LANTZsplint for elbow - numbness now only at DIP of 5th - do have pain at elbow during rainy weather -  and can carry about 4 lbs withoutpain - 5-6 lbs cause 8/10 pain at elbow - pt follow up with surgeon is in month    Rehab Potential Good   OT Frequency Monthly  3 wks    OT Duration 4 weeks   OT Treatment/Interventions Self-care/ADL training;Moist Heat;Fluidtherapy;Splinting;Patient/family education;Therapeutic exercises;Contrast Bath;Ultrasound;Scar mobilization;Passive range of motion;Parrafin;Manual Therapy   Plan assess progress at home the last 3 wks    OT Home Exercise Plan see pt instruction    Consulted and Agree with Plan of Care Patient      Patient will benefit from skilled therapeutic intervention in order to improve the following deficits and impairments:  Decreased range  of motion, Impaired flexibility, Increased edema, Decreased knowledge of precautions, Impaired UE functional use, Pain, Decreased strength  Visit  Diagnosis: Stiffness of right hand, not elsewhere classified  Stiffness of right wrist, not elsewhere classified  Numbness and tingling of hand  Muscle weakness (generalized)  Stiffness of right elbow, not elsewhere classified    Problem List Patient Active Problem List   Diagnosis Date Noted  . 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 Atypical ductal hyperplasia, breast 09/26/2012    Class: Diagnosis of    Rosalyn Gess OTR/L,CLT 12/02/2016, 1:11 PM  [place] [facility] [address] [place], [address] Phone: [phone]   Fax:  [phone]  Name: ESMAY AMSPACHER MRN: [id number] Date of Birth: 1946-06-21

## 2016-12-21 ENCOUNTER — Ambulatory Visit: Payer: PPO | Attending: Student | Admitting: Occupational Therapy

## 2016-12-21 DIAGNOSIS — R200 Anesthesia of skin: Secondary | ICD-10-CM | POA: Diagnosis not present

## 2016-12-21 DIAGNOSIS — M6281 Muscle weakness (generalized): Secondary | ICD-10-CM | POA: Diagnosis not present

## 2016-12-21 DIAGNOSIS — M25621 Stiffness of right elbow, not elsewhere classified: Secondary | ICD-10-CM | POA: Diagnosis not present

## 2016-12-21 DIAGNOSIS — R202 Paresthesia of skin: Secondary | ICD-10-CM | POA: Insufficient documentation

## 2016-12-21 DIAGNOSIS — M25631 Stiffness of right wrist, not elsewhere classified: Secondary | ICD-10-CM | POA: Insufficient documentation

## 2016-12-21 DIAGNOSIS — M25641 Stiffness of right hand, not elsewhere classified: Secondary | ICD-10-CM | POA: Diagnosis not present

## 2016-12-21 NOTE — Patient Instructions (Signed)
Discuss functional use - trouble with picking up objects in kitchen that is heavy , vacuuming , cutting with knife, open jars, can opener   Pt to add supination with wrist extention in  LANTZ splint in combination with  elbow extention  Cont with PROM for wrist - and digits flexion PROM  Add putty green to cont again with - grip decrease to 15 lbs 2nd time  And increase gripper to 10 lbs - only had one yellow band that is 5 lbs  Pt to do 2 sets  12 reps - of 2 lbs for wrist sup/pro, RD and UD unsupported  Wrist exention 2 sets of 12  With 2 lbs And then 3 sets of 2 lbs for elbow exercises- 12 reps   work on fatigue , no pain Numbness only in DIP of 5th digit now

## 2016-12-21 NOTE — Therapy (Signed)
[place] [facility] [address] [place], [address] Phone: [phone]   Fax:  [phone]  Occupational Therapy Treatment  Patient Details  Name: Megan Hodge MRN: [id number] Date of Birth: 02-Aug-1945 Referring Provider: Celesta Aver  Encounter Date: 12/21/2016      OT End of Session - 12/21/16 1148    Visit Number 43   Number of Visits 28   Date for OT Re-Evaluation 01/03/17   OT Start Time 1058   OT Stop Time 1140   OT Time Calculation (min) 42 min   Activity Tolerance Patient tolerated treatment well   Behavior During Therapy [facility] for tasks assessed/performed      Past Medical History:  Diagnosis Date  . Abnormal mammogram, unspecified    right breast  . Cancer ([place]) 1968   cervical cancer  . Hypertension 1995    Past Surgical History:  Procedure Laterality Date  . ABDOMINAL HYSTERECTOMY  1993  . APPENDECTOMY  1993  . BREAST BIOPSY Right 1992  . BREAST BIOPSY Right March 2014   Atypical ductal hyperplasia. Failed trial of tamoxifen for chemoprevention.  . COLONOSCOPY  08/2014   Dr. Vira Agar  . DILATION AND CURETTAGE OF UTERUS  1968  . JOINT REPLACEMENT Right 04/2016    There were no vitals filed for this visit.      Subjective Assessment - 12/21/16 1101    Subjective  Doing my elbow splint  2 -3 x day 59min and then  wrist and fingers stretch -when I take glove off the little finger wants to get stiff, numbnes only in tip of the 5th  - using it more turning doorknob , washing dishes little easier - couple of times vacuum   Patient Stated Goals Want to use my R dominant hand like prior to ROM - brushing teeth, eating , cutting food, phone , fixing hair,  bathing , dressing    Currently in Pain? No/denies            [facility] OT Assessment - 12/21/16 0001      AROM   Right Elbow Extension -24  PROM -18   Right Wrist Extension 53 Degrees     Strength   Right Hand Grip (lbs) 20   Right  Hand Lateral Pinch 10 lbs   Right Hand 3 Point Pinch 7 lbs   Left Hand Grip (lbs) 31   Left Hand Lateral Pinch 19 lbs   Left Hand 3 Point Pinch 9 lbs     Right Hand AROM   R Index  MCP 0-90 80 Degrees   R Index PIP 0-100 100 Degrees   R Long  MCP 0-90 85 Degrees   R Long PIP 0-100 100 Degrees   R Ring  MCP 0-90 85 Degrees   R Ring PIP 0-100 97 Degrees   R Little PIP 0-100 85 Degrees   R Little DIP 0-70 97 Degrees         Assess AROM for elbow and  wrist extention, digits flexion - and grip /prehension strength Strength to wrist 4+/5 in ROM plane  4- /5 at elbow See flowsheet  Discuss functional use - trouble with picking up objects in kitchen that is heavy , vacuuming , cutting with knife, open jars, can opener   Pt to add supination with wrist extention in  LANTZ splint in combination with  elbow extention  Cont with PROM for wrist - and digits flexion PROM  Add putty green  to cont again with - grip decrease to 15 lbs 2nd time  And increase gripper to 10 lbs - only had one yellow band that is 5 lbs  Pt to do 2 sets  12 reps - of 2 lbs for wrist sup/pro, RD and UD unsupported  Wrist exention 2 sets of 12  With 2 lbs And then 3 sets of 2 lbs for elbow exercises- 12 reps   work on fatigue , no pain Numbness only in DIP of 5th digit now                   OT Education - 12/21/16 1148    Education provided Yes   Education Details update HEP    Person(s) Educated Patient   Methods Explanation;Demonstration;Tactile cues;Verbal cues   Comprehension Verbal cues required;Returned demonstration;Verbalized understanding          OT Short Term Goals - 11/08/16 1935      OT SHORT TERM GOAL #1   Title R hand digits flexion able to touch palm to hold toothbrush and  utencils   Baseline Progress-  touching palm   - but MC's about 75 degrees flexion    Time 3   Period Weeks   Status On-going     OT SHORT TERM GOAL #2   Title Pt independing in HEP to decrease scar  tissue, increase  digits and wrist AROM to turn doorknob, do puzzles, wash dishes   Status Achieved     OT SHORT TERM GOAL #3   Title R wrist sup/pro and wrist extention improve with more than30 degrees to turn doorknob and pages, use sweeper duster   Status Achieved           OT Long Term Goals - 11/08/16 1936      OT LONG TERM GOAL #1   Title R elbow extention improve with more than 30 degrees  to  tie shoes and reach in pocket   Status Achieved     OT LONG TERM GOAL #2   Title R elbow flexion improve with with more than 20 degrees to do hair, apply deodorant, do jewelry    Status Achieved     OT LONG TERM GOAL #3   Title R grip strength improve for pt to have more than 50% grip compare to L hand    Baseline R 20, L 31   Status Achieved     OT LONG TERM GOAL #4   Title Function on PREE improve more than 20 points    Baseline 13/50 function on PREE    Time 3   Period Weeks   Status On-going               Plan - 12/21/16 1148    Clinical Impression Statement Pt now about 8 months out from surgery to elbow - still decrease elbow exention and strength - composite fist - grip strength - numbness improving and wrist exention improve for pt to stabilize at neutral during  gripping act - increase funtional use - pt to see MD next week - will follow up in the next 2 months with this OT maybe 2-3 more visits    Occupational performance deficits (Please refer to evaluation for details): ADL's;IADL's;Play;Leisure   Rehab Potential Good   OT Frequency Monthly   OT Duration 4 weeks   OT Treatment/Interventions Self-care/ADL training;Moist Heat;Fluidtherapy;Splinting;Patient/family education;Therapeutic exercises;Contrast Bath;Ultrasound;Scar mobilization;Passive range of motion;Parrafin;Manual Therapy   Plan assess strength and results from MD visit  OT Home Exercise Plan see pt instruction    Consulted and Agree with Plan of Care Patient      Patient will benefit from  skilled therapeutic intervention in order to improve the following deficits and impairments:  Decreased range of motion, Impaired flexibility, Increased edema, Decreased knowledge of precautions, Impaired UE functional use, Pain, Decreased strength  Visit Diagnosis: Stiffness of right hand, not elsewhere classified  Stiffness of right wrist, not elsewhere classified  Numbness and tingling of hand  Muscle weakness (generalized)  Stiffness of right elbow, not elsewhere classified    Problem List Patient Active Problem List   Diagnosis Date Noted  . 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 Atypical ductal hyperplasia, breast 09/26/2012    Class: Diagnosis of    Rosalyn Gess OTR/L,CLT 12/21/2016, 11:51 AM  [place] [facility] [address], [place], [address] Phone: [phone]   Fax:  [phone]  Name: Megan Hodge MRN: [id number] Date of Birth: 11-04-1945

## 2016-12-29 DIAGNOSIS — S52101D Unspecified fracture of upper end of right radius, subsequent encounter for closed fracture with routine healing: Secondary | ICD-10-CM | POA: Diagnosis not present

## 2016-12-29 DIAGNOSIS — M25521 Pain in right elbow: Secondary | ICD-10-CM | POA: Diagnosis not present

## 2016-12-29 DIAGNOSIS — Z96621 Presence of right artificial elbow joint: Secondary | ICD-10-CM | POA: Diagnosis not present

## 2016-12-29 DIAGNOSIS — S52001D Unspecified fracture of upper end of right ulna, subsequent encounter for closed fracture with routine healing: Secondary | ICD-10-CM | POA: Diagnosis not present

## 2017-01-05 ENCOUNTER — Ambulatory Visit: Payer: PPO | Admitting: Occupational Therapy

## 2017-01-05 DIAGNOSIS — M25621 Stiffness of right elbow, not elsewhere classified: Secondary | ICD-10-CM

## 2017-01-05 DIAGNOSIS — M25631 Stiffness of right wrist, not elsewhere classified: Secondary | ICD-10-CM

## 2017-01-05 DIAGNOSIS — M25641 Stiffness of right hand, not elsewhere classified: Secondary | ICD-10-CM | POA: Diagnosis not present

## 2017-01-05 DIAGNOSIS — R200 Anesthesia of skin: Secondary | ICD-10-CM

## 2017-01-05 DIAGNOSIS — M6281 Muscle weakness (generalized): Secondary | ICD-10-CM

## 2017-01-05 DIAGNOSIS — R202 Paresthesia of skin: Secondary | ICD-10-CM

## 2017-01-05 NOTE — Patient Instructions (Signed)
Same HEP for LANTZ splint  And  PROM for wrist extention and digits  Putty for grip and prehension   3lbs cont with for elbow - but do flexion palm up and in neutral, Sup/pro   Elbow extention in prone over edge of bed  External rotation  12 reps  2 x day  Can work in 2 wks to 2 sets  And can try ext rotation in sidelying 5 reps   Increase activity use

## 2017-01-05 NOTE — Therapy (Signed)
[place] [facility] [address] [place], [address] Phone: [phone]   Fax:  [phone]  Occupational Therapy Treatment  Patient Details  Name: Megan Hodge MRN: [id number] Date of Birth: June 10, 1946 Referring Provider: Celesta Aver  Encounter Date: 01/05/2017      OT End of Session - 01/05/17 1443    Visit Number 44   Number of Visits 48   Date for OT Re-Evaluation 05/04/17   OT Start Time 1124   OT Stop Time 1200   OT Time Calculation (min) 36 min   Activity Tolerance Patient tolerated treatment well   Behavior During Therapy [facility] for tasks assessed/performed      Past Medical History:  Diagnosis Date  . Abnormal mammogram, unspecified    right breast  . Cancer ([place]) 1968   cervical cancer  . Hypertension 1995    Past Surgical History:  Procedure Laterality Date  . ABDOMINAL HYSTERECTOMY  1993  . APPENDECTOMY  1993  . BREAST BIOPSY Right 1992  . BREAST BIOPSY Right March 2014   Atypical ductal hyperplasia. Failed trial of tamoxifen for chemoprevention.  . COLONOSCOPY  08/2014   Dr. Vira Agar  . DILATION AND CURETTAGE OF UTERUS  1968  . JOINT REPLACEMENT Right 04/2016    There were no vitals filed for this visit.      Subjective Assessment - 01/05/17 1440    Subjective  Seen Dr - was more interested in my elbow - but hardware in still in place - looking good - send you a script - I forgot to ask him about pain I have at times and some other questions - forgot - I am using it more - did vacuum    Patient Stated Goals Want to use my R dominant hand like prior to ROM - brushing teeth, eating , cutting food, phone , fixing hair,  bathing , dressing    Currently in Pain? No/denies            [facility] OT Assessment - 01/05/17 0001      AROM   Right Wrist Extension 60 Degrees     Strength   Right Hand Grip (lbs) 18   Right Hand Lateral Pinch 9 lbs   Right Hand 3 Point Pinch 7 lbs   Left Hand  Lateral Pinch 14 lbs     Right Hand AROM   R Index  MCP 0-90 85 Degrees   R Index PIP 0-100 100 Degrees   R Long  MCP 0-90 90 Degrees   R Long PIP 0-100 100 Degrees   R Ring  MCP 0-90 85 Degrees   R Ring PIP 0-100 100 Degrees   R Little PIP 0-100 90 Degrees   R Little DIP 0-70 100 Degrees      Assess AROM for elbow and wrist extention, digits flexion - and grip /prehension strength Strength to wrist 4+/5 in ROM plane  4 /5 at elbow- can do 3 lbs without pain  See flowsheet  Discuss functional use - trouble with picking up objects in kitchen that is heavy ,, cutting with knife, open jars, can opener   Numbness only in DIP of 5th digit now          Same HEP for LANTZ splint  And  PROM for wrist extention and digits  Putty for grip and prehension   3lbs cont with for elbow - but do flexion palm up and in neutral, Sup/pro   Elbow  extention in prone over edge of bed  External rotation  12 reps  2 x day  Can work in 2 wks to 2 sets  And can try ext rotation in sidelying 5 reps   Increase activity use               OT Education - 01/05/17 1443    Education provided Yes   Education Details upgrade HEP but still 3 lbs    Person(s) Educated Patient   Methods Explanation;Demonstration;Tactile cues;Verbal cues;Handout   Comprehension Verbal cues required;Returned demonstration;Verbalized understanding          OT Short Term Goals - 01/05/17 1446      OT SHORT TERM GOAL #1   Title R hand digits flexion able to touch palm to hold toothbrush and  utencils   Status Achieved     OT SHORT TERM GOAL #2   Title Pt independing in HEP to decrease scar tissue, increase  digits and wrist AROM to turn doorknob, do puzzles, wash dishes   Status Achieved     OT SHORT TERM GOAL #3   Title R wrist sup/pro and wrist extention improve with more than30 degrees to turn doorknob and pages, use sweeper duster   Status Achieved           OT Long Term Goals - 01/05/17  1446      OT LONG TERM GOAL #1   Title R elbow extention improve with more than 30 degrees  to  tie shoes and reach in pocket   Baseline extention -22 and PROM - 15   Status Achieved     OT LONG TERM GOAL #2   Title R elbow flexion improve with with more than 20 degrees to do hair, apply deodorant, do jewelry    Status Achieved     OT LONG TERM GOAL #3   Title R grip strength improve for pt to have more than 50% grip compare to L hand      OT LONG TERM GOAL #4   Title Function on PREE improve more than 20 points    Baseline 13/50 function on PREE    Time 3   Period Weeks   Status Achieved     OT LONG TERM GOAL #5   Title R elbow strength increase for pt to carry , push and pull more than 5 lbs without increase symptoms    Baseline  3 lbs able to do without pain    Time 4   Period Months   Status New               Plan - 01/05/17 1444    Clinical Impression Statement Pt made progress in digits flexion , wrist extention and elbow extention - increase strength in elbow flexion and extention - report doing better with 3 lbs - less pain - and able to perfrom more act around house and more ease like vacuuming , cooking ,- pt seensurgeon - hardware intact - new order form MD to work on ROM and strength - pt to be seen once month for 4 montsh    Occupational performance deficits (Please refer to evaluation for details): ADL's;IADL's;Play;Leisure   Rehab Potential Good   OT Frequency Monthly   OT Duration Other (comment)  4 months   OT Treatment/Interventions Self-care/ADL training;Moist Heat;Fluidtherapy;Splinting;Patient/family education;Therapeutic exercises;Contrast Bath;Ultrasound;Scar mobilization;Passive range of motion;Parrafin;Manual Therapy   Plan upgrade HEP as needed    OT Home Exercise Plan see pt instruction  Consulted and Agree with Plan of Care Patient      Patient will benefit from skilled therapeutic intervention in order to improve the following deficits  and impairments:  Decreased range of motion, Impaired flexibility, Increased edema, Decreased knowledge of precautions, Impaired UE functional use, Pain, Decreased strength  Visit Diagnosis: Stiffness of right hand, not elsewhere classified  Stiffness of right wrist, not elsewhere classified  Numbness and tingling of hand  Muscle weakness (generalized)  Stiffness of right elbow, not elsewhere classified    Problem List Patient Active Problem List   Diagnosis Date Noted  . 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 Atypical ductal hyperplasia, breast 09/26/2012    Class: Diagnosis of    Rosalyn Gess OTR/L,CLT 01/05/2017, 2:48 PM  [place] [facility] [address], [place], [address] Phone: [phone]   Fax:  [phone]  Name: Megan Hodge MRN: [id number] Date of Birth: [birth year]

## 2017-02-02 DIAGNOSIS — R739 Hyperglycemia, unspecified: Secondary | ICD-10-CM | POA: Diagnosis not present

## 2017-02-02 DIAGNOSIS — E782 Mixed hyperlipidemia: Secondary | ICD-10-CM | POA: Diagnosis not present

## 2017-02-02 DIAGNOSIS — N183 Chronic kidney disease, stage 3 (moderate): Secondary | ICD-10-CM | POA: Diagnosis not present

## 2017-02-02 DIAGNOSIS — I10 Essential (primary) hypertension: Secondary | ICD-10-CM | POA: Diagnosis not present

## 2017-02-03 ENCOUNTER — Ambulatory Visit: Payer: PPO | Attending: Student | Admitting: Occupational Therapy

## 2017-02-03 DIAGNOSIS — R200 Anesthesia of skin: Secondary | ICD-10-CM | POA: Diagnosis not present

## 2017-02-03 DIAGNOSIS — R202 Paresthesia of skin: Secondary | ICD-10-CM | POA: Diagnosis not present

## 2017-02-03 DIAGNOSIS — M6281 Muscle weakness (generalized): Secondary | ICD-10-CM | POA: Insufficient documentation

## 2017-02-03 DIAGNOSIS — M25641 Stiffness of right hand, not elsewhere classified: Secondary | ICD-10-CM | POA: Diagnosis not present

## 2017-02-03 DIAGNOSIS — M25621 Stiffness of right elbow, not elsewhere classified: Secondary | ICD-10-CM | POA: Diagnosis not present

## 2017-02-03 DIAGNOSIS — M25631 Stiffness of right wrist, not elsewhere classified: Secondary | ICD-10-CM | POA: Diagnosis not present

## 2017-02-03 NOTE — Therapy (Signed)
[place] [facility] [address] [place], [address] Phone: [phone]   Fax:  [phone]  Occupational Therapy Treatment  Patient Details  Name: Megan Hodge MRN: [id number] Date of Birth: Feb 15, 1946 Referring Provider: Celesta Aver  Encounter Date: 02/03/2017      OT End of Session - 02/03/17 1614    Visit Number 45   Number of Visits 48   Date for OT Re-Evaluation 05/04/17   OT Start Time 1130   OT Stop Time 1210   OT Time Calculation (min) 40 min   Activity Tolerance Patient tolerated treatment well   Behavior During Therapy [facility] for tasks assessed/performed      Past Medical History:  Diagnosis Date  . Abnormal mammogram, unspecified    right breast  . Cancer ([place]) 1968   cervical cancer  . Hypertension 1995    Past Surgical History:  Procedure Laterality Date  . ABDOMINAL HYSTERECTOMY  1993  . APPENDECTOMY  1993  . BREAST BIOPSY Right 1992  . BREAST BIOPSY Right March 2014   Atypical ductal hyperplasia. Failed trial of tamoxifen for chemoprevention.  . COLONOSCOPY  08/2014   Dr. Vira Agar  . DILATION AND CURETTAGE OF UTERUS  1968  . JOINT REPLACEMENT Right 04/2016    There were no vitals filed for this visit.      Subjective Assessment - 02/03/17 1612    Subjective  I am doing okay - did my elbow splint about once day - maybe when walking past 3 lbs weight do some wrist and elbow exercises - stopped the putty because it hurt my fingers some - I can tell my  arm little stronger - can lift things better in kitchen ,  using it more    Patient Stated Goals Want to use my R dominant hand like prior to ROM - brushing teeth, eating , cutting food, phone , fixing hair,  bathing , dressing    Currently in Pain? No/denies            [facility] OT Assessment - 02/03/17 0001      AROM   Right Wrist Extension 60 Degrees     Strength   Right Hand Grip (lbs) 21   Right Hand Lateral Pinch 10 lbs   Right Hand 3 Point Pinch 8 lbs   Left Hand Grip (lbs) 31   Left Hand Lateral Pinch 14 lbs   Left Hand 3 Point Pinch 10 lbs       Assess AROM for elbow and wrist extention, digits flexion - and grip /prehension strength PROM for elbow extention - -16 and AROM -24  Strength to wrist 4+/5 in ROM plane  4 /5 at elbow- can do 3 lbs without pain  Pt to add shoulder to HEP   Discuss functional use - that nee to increase gradually weight and use  Numbness only in DIP of 5th digit now    Same HEP for LANTZ splint -still can gain some  And  PROM for wrist extention and digits   3lbs cont with for elbow - but do flexion palm up and in neutral, Sup/pro   Elbow extention in prone over edge of bed  Can add scapula retraction , and shoulder press  12 reps 3 lbs   Increase activity use                      OT Education - 02/03/17 1614    Education  provided Yes   Education Details HEP updated    Person(s) Educated Patient   Methods Explanation;Demonstration;Tactile cues;Verbal cues   Comprehension Verbal cues required;Returned demonstration;Verbalized understanding          OT Short Term Goals - 01/05/17 1446      OT SHORT TERM GOAL #1   Title R hand digits flexion able to touch palm to hold toothbrush and  utencils   Status Achieved     OT SHORT TERM GOAL #2   Title Pt independing in HEP to decrease scar tissue, increase  digits and wrist AROM to turn doorknob, do puzzles, wash dishes   Status Achieved     OT SHORT TERM GOAL #3   Title R wrist sup/pro and wrist extention improve with more than30 degrees to turn doorknob and pages, use sweeper duster   Status Achieved           OT Long Term Goals - 01/05/17 1446      OT LONG TERM GOAL #1   Title R elbow extention improve with more than 30 degrees  to  tie shoes and reach in pocket   Baseline extention -22 and PROM - 15   Status Achieved     OT LONG TERM GOAL #2   Title R elbow flexion improve  with with more than 20 degrees to do hair, apply deodorant, do jewelry    Status Achieved     OT LONG TERM GOAL #3   Title R grip strength improve for pt to have more than 50% grip compare to L hand      OT LONG TERM GOAL #4   Title Function on PREE improve more than 20 points    Baseline 13/50 function on PREE    Time 3   Period Weeks   Status Achieved     OT LONG TERM GOAL #5   Title R elbow strength increase for pt to carry , push and pull more than 5 lbs without increase symptoms    Baseline  3 lbs able to do without pain    Time 4   Period Months   Status New               Plan - 02/03/17 1615    Clinical Impression Statement Pt showed increase grip , and prehension strength since month ago - wrist extention and elbow about the same - PROM still more than AROM at elbow extention - still at 3 lbs for elbow - if more - has pain - pt to add some shoulder HEP  with 3 lbs and increase functional use and strength  will follow up in 8 wks    Occupational performance deficits (Please refer to evaluation for details): ADL's;IADL's;Play;Leisure   Rehab Potential Good   OT Frequency --  8 wks    OT Duration Other (comment)   OT Treatment/Interventions Self-care/ADL training;Moist Heat;Fluidtherapy;Splinting;Patient/family education;Therapeutic exercises;Contrast Bath;Ultrasound;Scar mobilization;Passive range of motion;Parrafin;Manual Therapy   Plan follow up in 8 wks - reassess    Clinical Decision Making Several treatment options, min-mod task modification necessary   OT Home Exercise Plan see pt instruction    Consulted and Agree with Plan of Care Patient      Patient will benefit from skilled therapeutic intervention in order to improve the following deficits and impairments:  Decreased range of motion, Impaired flexibility, Increased edema, Decreased knowledge of precautions, Impaired UE functional use, Pain, Decreased strength  Visit Diagnosis: Stiffness of right hand,  not elsewhere  classified  Stiffness of right wrist, not elsewhere classified  Numbness and tingling of hand  Muscle weakness (generalized)  Stiffness of right elbow, not elsewhere classified    Problem List Patient Active Problem List   Diagnosis Date Noted  . 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 Atypical ductal hyperplasia, breast 09/26/2012    Class: Diagnosis of    Rosalyn Gess OTR/L,CLT  02/03/2017, 4:18 PM  [place] [facility] [address], [place], [address] Phone: [phone]   Fax:  [phone]  Name: Megan Hodge MRN: [id number] Date of Birth: 1946-01-06

## 2017-02-03 NOTE — Patient Instructions (Signed)
Pt to cont with wrist extention stretch  Elbow splint - still can gain some  Use 3 lbs for elbow , but add some shoulder press, retraction of scapula , elbow extention  Then just increase functional use of hand and gradually increase weight

## 2017-02-09 DIAGNOSIS — K219 Gastro-esophageal reflux disease without esophagitis: Secondary | ICD-10-CM | POA: Diagnosis not present

## 2017-02-09 DIAGNOSIS — Z0000 Encounter for general adult medical examination without abnormal findings: Secondary | ICD-10-CM | POA: Diagnosis not present

## 2017-02-09 DIAGNOSIS — E782 Mixed hyperlipidemia: Secondary | ICD-10-CM | POA: Diagnosis not present

## 2017-02-09 DIAGNOSIS — M47816 Spondylosis without myelopathy or radiculopathy, lumbar region: Secondary | ICD-10-CM | POA: Diagnosis not present

## 2017-02-09 DIAGNOSIS — N6099 Unspecified benign mammary dysplasia of unspecified breast: Secondary | ICD-10-CM | POA: Diagnosis not present

## 2017-02-09 DIAGNOSIS — K2270 Barrett's esophagus without dysplasia: Secondary | ICD-10-CM | POA: Diagnosis not present

## 2017-02-09 DIAGNOSIS — R7303 Prediabetes: Secondary | ICD-10-CM | POA: Diagnosis not present

## 2017-02-09 DIAGNOSIS — I10 Essential (primary) hypertension: Secondary | ICD-10-CM | POA: Diagnosis not present

## 2017-03-24 ENCOUNTER — Ambulatory Visit: Payer: PPO | Attending: Student | Admitting: Occupational Therapy

## 2017-03-24 DIAGNOSIS — M25641 Stiffness of right hand, not elsewhere classified: Secondary | ICD-10-CM | POA: Insufficient documentation

## 2017-03-24 DIAGNOSIS — R202 Paresthesia of skin: Secondary | ICD-10-CM | POA: Insufficient documentation

## 2017-03-24 DIAGNOSIS — M6281 Muscle weakness (generalized): Secondary | ICD-10-CM | POA: Insufficient documentation

## 2017-03-24 DIAGNOSIS — M25631 Stiffness of right wrist, not elsewhere classified: Secondary | ICD-10-CM | POA: Insufficient documentation

## 2017-03-24 DIAGNOSIS — R200 Anesthesia of skin: Secondary | ICD-10-CM

## 2017-03-24 DIAGNOSIS — M25621 Stiffness of right elbow, not elsewhere classified: Secondary | ICD-10-CM | POA: Diagnosis not present

## 2017-03-24 NOTE — Therapy (Signed)
[place] [facility] [address] [place], [address] Phone: [phone]   Fax:  [phone]  Occupational Therapy Treatment/discharge Patient Details  Name: Megan Hodge MRN: [id number] Date of Birth: 1946-02-28 Referring Provider: Celesta Aver  Encounter Date: 03/24/2017      OT End of Session - 03/24/17 1238    Visit Number 46   Number of Visits 46   Date for OT Re-Evaluation 03/24/17   OT Start Time 1116   OT Stop Time 1158   OT Time Calculation (min) 42 min   Activity Tolerance Patient tolerated treatment well   Behavior During Therapy [facility] for tasks assessed/performed      Past Medical History:  Diagnosis Date  . Abnormal mammogram, unspecified    right breast  . Cancer ([place]) 1968   cervical cancer  . Hypertension 1995    Past Surgical History:  Procedure Laterality Date  . ABDOMINAL HYSTERECTOMY  1993  . APPENDECTOMY  1993  . BREAST BIOPSY Right 1992  . BREAST BIOPSY Right March 2014   Atypical ductal hyperplasia. Failed trial of tamoxifen for chemoprevention.  . COLONOSCOPY  08/2014   Dr. Vira Agar  . DILATION AND CURETTAGE OF UTERUS  1968  . JOINT REPLACEMENT Right 04/2016    There were no vitals filed for this visit.      Subjective Assessment - 03/24/17 1231    Subjective  I am doing better -my fist feels better - I can lift and do few reps with 5 lbs weight with no pain - can lift pots and coffee pot - hand sometimes still tight  at times - doing elbow splint about once day - sometimes skip day     Patient Stated Goals Want to use my R dominant hand like prior to ROM - brushing teeth, eating , cutting food, phone , fixing hair,  bathing , dressing    Currently in Pain? No/denies            [facility] OT Assessment - 03/24/17 0001      AROM   Right Wrist Extension 60 Degrees     Strength   Right Hand Grip (lbs) 21   Right Hand Lateral Pinch 11 lbs   Right Hand 3 Point Pinch 8 lbs   Left Hand Grip (lbs) 35   Left Hand Lateral Pinch 14 lbs   Left Hand 3 Point Pinch 10 lbs      Assess AROM for digits , wrist and elbow   was about the same than 6 wks ago  Strength 4+Pt do /5 for elbow and wrist in all planes  Elbow feels more stabile and secure and less paiin  Grip about the same   Pt able to carry 5 lbs and lift with no pain  But 6 lbs did cause some pain and pull   Assess functional use for R hand at home  Cooking , lifting pots , coffee pot - did try and push lawnmower - pt felt okay - but pt took over after a min or 2                         OT Education - 03/24/17 1237    Education provided Yes   Education Details cont functional task and increase weight gradually    Person(s) Educated Patient   Methods Explanation;Demonstration;Tactile cues;Verbal cues   Comprehension Verbalized understanding;Returned demonstration;Verbal cues required  OT Short Term Goals - 03/24/17 1242      OT SHORT TERM GOAL #1   Title R hand digits flexion able to touch palm to hold toothbrush and  utencils   Status Achieved     OT SHORT TERM GOAL #2   Title Pt independing in HEP to decrease scar tissue, increase  digits and wrist AROM to turn doorknob, do puzzles, wash dishes   Status Achieved     OT SHORT TERM GOAL #3   Title R wrist sup/pro and wrist extention improve with more than30 degrees to turn doorknob and pages, use sweeper duster   Status Achieved           OT Long Term Goals - 03/24/17 1243      OT LONG TERM GOAL #1   Title R elbow extention improve with more than 30 degrees  to  tie shoes and reach in pocket   Status Achieved     OT LONG TERM GOAL #2   Title R elbow flexion improve with with more than 20 degrees to do hair, apply deodorant, do jewelry      OT LONG TERM GOAL #3   Title R grip strength improve for pt to have more than 50% grip compare to L hand    Baseline R 20, L 31   Status Achieved     OT LONG TERM  GOAL #4   Title Function on PREE improve more than 20 points    Baseline 13/50 function on PREE    Status Achieved     OT LONG TERM GOAL #5   Title R elbow strength increase for pt to carry , push and pull more than 5 lbs without increase symptoms    Baseline 5 lbs without pain    Status Achieved               Plan - 03/24/17 1241    Clinical Impression Statement Pt showed great progress in functional strength the last 6 wks - can lift or carry 5 lbs now without pain - grip still about the same, ROM at ebow , digits , wrist still the same - but pain better and using it more -pt seeing  surgeon next month - pt discharge at this time with functional use  and increase strength gradually     Occupational performance deficits (Please refer to evaluation for details): ADL's;IADL's;Play;Leisure   OT Treatment/Interventions Self-care/ADL training;Moist Heat;Fluidtherapy;Splinting;Patient/family education;Therapeutic exercises;Contrast Bath;Ultrasound;Scar mobilization;Passive range of motion;Parrafin;Manual Therapy   Plan Pt to see MD next month - pt can cont with functional strengthening    Consulted and Agree with Plan of Care Patient      Patient will benefit from skilled therapeutic intervention in order to improve the following deficits and impairments:     Visit Diagnosis: Stiffness of right hand, not elsewhere classified  Stiffness of right wrist, not elsewhere classified  Numbness and tingling of hand  Muscle weakness (generalized)  Stiffness of right elbow, not elsewhere classified    Problem List Patient Active Problem List   Diagnosis Date Noted  . 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 Atypical ductal hyperplasia, breast 09/26/2012    Class: Diagnosis of    Rosalyn Gess OTR/L,CLT 03/24/2017, 12:50 PM  [place] [facility] [address] [place], [address] Phone: [phone]    Fax:  [phone]  Name: Megan Hodge MRN: [id number] Date of Birth: [birth year]

## 2017-05-04 DIAGNOSIS — S52101D Unspecified fracture of upper end of right radius, subsequent encounter for closed fracture with routine healing: Secondary | ICD-10-CM | POA: Diagnosis not present

## 2017-05-04 DIAGNOSIS — Y33XXXD Other specified events, undetermined intent, subsequent encounter: Secondary | ICD-10-CM | POA: Diagnosis not present

## 2017-05-04 DIAGNOSIS — S52001D Unspecified fracture of upper end of right ulna, subsequent encounter for closed fracture with routine healing: Secondary | ICD-10-CM | POA: Diagnosis not present

## 2017-05-17 DIAGNOSIS — D229 Melanocytic nevi, unspecified: Secondary | ICD-10-CM | POA: Diagnosis not present

## 2017-05-17 DIAGNOSIS — L820 Inflamed seborrheic keratosis: Secondary | ICD-10-CM | POA: Diagnosis not present

## 2017-05-17 DIAGNOSIS — L853 Xerosis cutis: Secondary | ICD-10-CM | POA: Diagnosis not present

## 2017-05-17 DIAGNOSIS — D1800 Hemangioma unspecified site: Secondary | ICD-10-CM | POA: Diagnosis not present

## 2017-05-17 DIAGNOSIS — L814 Other melanin hyperpigmentation: Secondary | ICD-10-CM | POA: Diagnosis not present

## 2017-05-17 DIAGNOSIS — Z85828 Personal history of other malignant neoplasm of skin: Secondary | ICD-10-CM | POA: Diagnosis not present

## 2017-05-17 DIAGNOSIS — L918 Other hypertrophic disorders of the skin: Secondary | ICD-10-CM | POA: Diagnosis not present

## 2017-05-17 DIAGNOSIS — L821 Other seborrheic keratosis: Secondary | ICD-10-CM | POA: Diagnosis not present

## 2017-05-17 DIAGNOSIS — Z1283 Encounter for screening for malignant neoplasm of skin: Secondary | ICD-10-CM | POA: Diagnosis not present

## 2017-07-15 DIAGNOSIS — H2513 Age-related nuclear cataract, bilateral: Secondary | ICD-10-CM | POA: Diagnosis not present

## 2017-08-09 ENCOUNTER — Encounter: Payer: Self-pay | Admitting: General Surgery

## 2017-08-10 ENCOUNTER — Encounter: Payer: Self-pay | Admitting: General Surgery

## 2017-08-17 DIAGNOSIS — E782 Mixed hyperlipidemia: Secondary | ICD-10-CM | POA: Diagnosis not present

## 2017-08-17 DIAGNOSIS — I10 Essential (primary) hypertension: Secondary | ICD-10-CM | POA: Diagnosis not present

## 2017-08-17 DIAGNOSIS — R7303 Prediabetes: Secondary | ICD-10-CM | POA: Diagnosis not present

## 2017-08-24 DIAGNOSIS — Z1231 Encounter for screening mammogram for malignant neoplasm of breast: Secondary | ICD-10-CM | POA: Diagnosis not present

## 2017-08-24 DIAGNOSIS — I10 Essential (primary) hypertension: Secondary | ICD-10-CM | POA: Diagnosis not present

## 2017-08-24 DIAGNOSIS — R7303 Prediabetes: Secondary | ICD-10-CM | POA: Diagnosis not present

## 2017-08-24 DIAGNOSIS — N183 Chronic kidney disease, stage 3 (moderate): Secondary | ICD-10-CM | POA: Diagnosis not present

## 2017-08-24 DIAGNOSIS — E782 Mixed hyperlipidemia: Secondary | ICD-10-CM | POA: Diagnosis not present

## 2017-09-22 DIAGNOSIS — Z1231 Encounter for screening mammogram for malignant neoplasm of breast: Secondary | ICD-10-CM | POA: Diagnosis not present

## 2017-10-12 DIAGNOSIS — D126 Benign neoplasm of colon, unspecified: Secondary | ICD-10-CM | POA: Diagnosis not present

## 2017-10-12 DIAGNOSIS — K2270 Barrett's esophagus without dysplasia: Secondary | ICD-10-CM | POA: Diagnosis not present

## 2017-12-21 ENCOUNTER — Ambulatory Visit
Admission: RE | Admit: 2017-12-21 | Discharge: 2017-12-21 | Disposition: A | Discharge status: Home / Self Care | Payer: PPO | Source: Ambulatory Visit | Attending: Unknown Physician Specialty | Admitting: Unknown Physician Specialty

## 2017-12-21 ENCOUNTER — Ambulatory Visit: Payer: PPO | Admitting: Anesthesiology

## 2017-12-21 ENCOUNTER — Encounter
Admission: RE | Disposition: A | Discharge status: Home / Self Care | Payer: Self-pay | Source: Ambulatory Visit | Attending: Unknown Physician Specialty

## 2017-12-21 ENCOUNTER — Encounter: Payer: Self-pay | Admitting: *Deleted

## 2017-12-21 DIAGNOSIS — K219 Gastro-esophageal reflux disease without esophagitis: Secondary | ICD-10-CM | POA: Diagnosis not present

## 2017-12-21 DIAGNOSIS — Z79899 Other long term (current) drug therapy: Secondary | ICD-10-CM | POA: Insufficient documentation

## 2017-12-21 DIAGNOSIS — Z800 Family history of malignant neoplasm of digestive organs: Secondary | ICD-10-CM | POA: Insufficient documentation

## 2017-12-21 DIAGNOSIS — K2950 Unspecified chronic gastritis without bleeding: Secondary | ICD-10-CM | POA: Insufficient documentation

## 2017-12-21 DIAGNOSIS — Z09 Encounter for follow-up examination after completed treatment for conditions other than malignant neoplasm: Secondary | ICD-10-CM | POA: Insufficient documentation

## 2017-12-21 DIAGNOSIS — K5289 Other specified noninfective gastroenteritis and colitis: Secondary | ICD-10-CM | POA: Diagnosis not present

## 2017-12-21 DIAGNOSIS — I10 Essential (primary) hypertension: Secondary | ICD-10-CM | POA: Diagnosis not present

## 2017-12-21 DIAGNOSIS — K449 Diaphragmatic hernia without obstruction or gangrene: Secondary | ICD-10-CM | POA: Insufficient documentation

## 2017-12-21 DIAGNOSIS — Q438 Other specified congenital malformations of intestine: Secondary | ICD-10-CM | POA: Insufficient documentation

## 2017-12-21 DIAGNOSIS — K5730 Diverticulosis of large intestine without perforation or abscess without bleeding: Secondary | ICD-10-CM | POA: Insufficient documentation

## 2017-12-21 DIAGNOSIS — K5790 Diverticulosis of intestine, part unspecified, without perforation or abscess without bleeding: Secondary | ICD-10-CM | POA: Diagnosis not present

## 2017-12-21 DIAGNOSIS — Z8541 Personal history of malignant neoplasm of cervix uteri: Secondary | ICD-10-CM | POA: Diagnosis not present

## 2017-12-21 DIAGNOSIS — K640 First degree hemorrhoids: Secondary | ICD-10-CM | POA: Insufficient documentation

## 2017-12-21 DIAGNOSIS — K621 Rectal polyp: Secondary | ICD-10-CM | POA: Insufficient documentation

## 2017-12-21 DIAGNOSIS — K228 Other specified diseases of esophagus: Secondary | ICD-10-CM | POA: Diagnosis not present

## 2017-12-21 DIAGNOSIS — K210 Gastro-esophageal reflux disease with esophagitis: Secondary | ICD-10-CM | POA: Diagnosis not present

## 2017-12-21 DIAGNOSIS — K649 Unspecified hemorrhoids: Secondary | ICD-10-CM | POA: Diagnosis not present

## 2017-12-21 DIAGNOSIS — K635 Polyp of colon: Secondary | ICD-10-CM | POA: Diagnosis not present

## 2017-12-21 DIAGNOSIS — Z86010 Personal history of colonic polyps: Secondary | ICD-10-CM | POA: Insufficient documentation

## 2017-12-21 DIAGNOSIS — D128 Benign neoplasm of rectum: Secondary | ICD-10-CM | POA: Diagnosis not present

## 2017-12-21 DIAGNOSIS — Z1211 Encounter for screening for malignant neoplasm of colon: Secondary | ICD-10-CM | POA: Insufficient documentation

## 2017-12-21 DIAGNOSIS — K3189 Other diseases of stomach and duodenum: Secondary | ICD-10-CM | POA: Diagnosis not present

## 2017-12-21 DIAGNOSIS — Z8719 Personal history of other diseases of the digestive system: Secondary | ICD-10-CM | POA: Insufficient documentation

## 2017-12-21 HISTORY — DX: Barrett's esophagus without dysplasia: K22.70

## 2017-12-21 HISTORY — DX: Gastroparesis: K31.84

## 2017-12-21 HISTORY — DX: Gastro-esophageal reflux disease without esophagitis: K21.9

## 2017-12-21 HISTORY — PX: COLONOSCOPY WITH PROPOFOL: SHX5780

## 2017-12-21 HISTORY — DX: Unspecified osteoarthritis, unspecified site: M19.90

## 2017-12-21 HISTORY — PX: ESOPHAGOGASTRODUODENOSCOPY (EGD) WITH PROPOFOL: SHX5813

## 2017-12-21 HISTORY — DX: Chronic kidney disease, unspecified: N18.9

## 2017-12-21 SURGERY — ESOPHAGOGASTRODUODENOSCOPY (EGD) WITH PROPOFOL
Anesthesia: General

## 2017-12-21 MED ORDER — FENTANYL CITRATE (PF) 100 MCG/2ML IJ SOLN
INTRAMUSCULAR | Status: AC
  Filled 2017-12-21: qty 2

## 2017-12-21 MED ORDER — LIDOCAINE 2% (20 MG/ML) 5 ML SYRINGE
INTRAMUSCULAR | Status: DC | PRN
  Administered 2017-12-21: 30 mg via INTRAVENOUS

## 2017-12-21 MED ORDER — PROPOFOL 10 MG/ML IV BOLUS
INTRAVENOUS | Status: DC | PRN
  Administered 2017-12-21: 100 mg via INTRAVENOUS

## 2017-12-21 MED ORDER — SODIUM CHLORIDE 0.9 % IV SOLN
INTRAVENOUS | Status: DC
  Administered 2017-12-21: 11:00:00 via INTRAVENOUS
  Administered 2017-12-21: 1000 mL via INTRAVENOUS

## 2017-12-21 MED ORDER — FENTANYL CITRATE (PF) 100 MCG/2ML IJ SOLN
INTRAMUSCULAR | Status: DC | PRN
  Administered 2017-12-21 (×2): 50 ug via INTRAVENOUS

## 2017-12-21 MED ORDER — PROPOFOL 10 MG/ML IV BOLUS
INTRAVENOUS | Status: AC
  Filled 2017-12-21: qty 20

## 2017-12-21 MED ORDER — PIPERACILLIN-TAZOBACTAM 3.375 G IVPB
INTRAVENOUS | Status: AC
  Filled 2017-12-21: qty 50

## 2017-12-21 MED ORDER — PROPOFOL 500 MG/50ML IV EMUL
INTRAVENOUS | Status: DC | PRN
  Administered 2017-12-21: 160 ug/kg/min via INTRAVENOUS

## 2017-12-21 MED ORDER — PIPERACILLIN-TAZOBACTAM 3.375 G IVPB 30 MIN
3.3750 g | Freq: Once | INTRAVENOUS | Status: AC
  Administered 2017-12-21: 3.375 g via INTRAVENOUS
  Filled 2017-12-21: qty 50

## 2017-12-21 MED ORDER — PROPOFOL 500 MG/50ML IV EMUL
INTRAVENOUS | Status: AC
Start: 2017-12-21 — End: ?
  Filled 2017-12-21: qty 50

## 2017-12-21 MED ORDER — PHENYLEPHRINE HCL 10 MG/ML IJ SOLN
INTRAMUSCULAR | Status: DC | PRN
  Administered 2017-12-21 (×2): 100 ug via INTRAVENOUS

## 2017-12-21 MED ORDER — LIDOCAINE HCL (PF) 2 % IJ SOLN
INTRAMUSCULAR | Status: AC
  Filled 2017-12-21: qty 10

## 2017-12-21 NOTE — Op Note (Signed)
[facility] Gastroenterology Patient Name: Megan Hodge Procedure Date: 12/21/2017 11:03 AM MRN: [id number] Account #: [id number] Date of Birth: 12/21/1945 Admit Type: Outpatient Age: 72 Room: [facility] ENDO ROOM 3 Gender: Female Note Status: Finalized Procedure:            Upper GI endoscopy Indications:          Follow-up of Barrett's esophagus Providers:            Manya Silvas, MD Referring MD:         Precious Bard, MD (Referring MD) Medicines:            Propofol per Anesthesia Complications:        No immediate complications. Procedure:            Pre-Anesthesia Assessment:                       - After reviewing the risks and benefits, the patient                        was deemed in satisfactory condition to undergo the                        procedure.                       After obtaining informed consent, the endoscope was                        passed under direct vision. Throughout the procedure,                        the patient's blood pressure, pulse, and oxygen                        saturations were monitored continuously. The Endoscope                        was introduced through the mouth, and advanced to the                        second part of duodenum. The upper GI endoscopy was                        accomplished without difficulty. The patient tolerated                        the procedure well. Findings:      The examined esophagus was normal. 4 biopsies done in lower esophagus.       Some patches of abnormal mucosa in upper esophagus which are always       gastric metaplasia.      A small hiatal hernia was present.      The stomach was normal.      The examined duodenum was normal. Impression:           - Normal esophagus.                       - Small hiatal hernia.                       - Normal stomach.                       -  Normal examined duodenum.                       - No specimens collected. Recommendation:        - Await pathology results. Manya Silvas, MD 12/21/2017 11:20:52 AM This report has been signed electronically. Number of Addenda: 0 Note Initiated On: 12/21/2017 11:03 AM      [facility]

## 2017-12-21 NOTE — Anesthesia Preprocedure Evaluation (Signed)
Anesthesia Evaluation  Patient identified by MRN, date of birth, ID band Patient awake    Reviewed: Allergy & Precautions, H&P , NPO status , Patient's Chart, lab work & pertinent test results, reviewed documented beta blocker date and time   History of Anesthesia Complications Negative for: history of anesthetic complications  Airway Mallampati: I  TM Distance: >3 FB Neck ROM: full    Dental  (+) Caps, Dental Advidsory Given, Teeth Intact   Pulmonary neg pulmonary ROS,    Pulmonary exam normal        Cardiovascular Exercise Tolerance: Good hypertension, (-) angina(-) CAD, (-) Past MI, (-) Cardiac Stents and (-) CABG (-) dysrhythmias (-) Valvular Problems/Murmurs     Neuro/Psych negative neurological ROS  negative psych ROS   GI/Hepatic Neg liver ROS, GERD  ,  Endo/Other  negative endocrine ROS  Renal/GU Renal disease  negative genitourinary   Musculoskeletal   Abdominal   Peds  Hematology negative hematology ROS (+)   Anesthesia Other Findings Past Medical History: No date: Abnormal mammogram, unspecified     Comment:  right breast No date: Arthritis     Comment:  lumbar spine No date: Barrett's esophagus 1968: Cancer ([place])     Comment:  cervical cancer No date: 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 1999: Gastroparesis No date: GERD (gastroesophageal reflux disease) 1995: Hypertension   Reproductive/Obstetrics negative OB ROS                             Anesthesia Physical Anesthesia Plan  ASA: II  Anesthesia Plan: General   Post-op Pain Management:    Induction: Intravenous  PONV Risk Score and Plan: 3 and Propofol infusion  Airway Management Planned: Nasal Cannula  Additional Equipment:   Intra-op Plan:   Post-operative Plan:   Informed Consent: I have reviewed the patients History and Physical, chart, labs and discussed the procedure  including the risks, benefits and alternatives for the proposed anesthesia with the patient or authorized representative who has indicated his/her understanding and acceptance.   Dental Advisory Given  Plan Discussed with: Anesthesiologist, CRNA and Surgeon  Anesthesia Plan Comments:         Anesthesia Quick Evaluation

## 2017-12-21 NOTE — Op Note (Signed)
[facility] Gastroenterology Patient Name: Megan Hodge Procedure Date: 12/21/2017 11:03 AM MRN: [id number] Account #: [id number] Date of Birth: 1945-09-29 Admit Type: Outpatient Age: 72 Room: [facility] ENDO ROOM 3 Gender: Female Note Status: Finalized Procedure:            Colonoscopy Indications:          Family history of colon cancer in a first-degree                        relative Providers:            Manya Silvas, MD Referring MD:         Precious Bard, MD (Referring MD) Medicines:            Propofol per Anesthesia Complications:        No immediate complications. Procedure:            Pre-Anesthesia Assessment:                       - After reviewing the risks and benefits, the patient                        was deemed in satisfactory condition to undergo the                        procedure.                       After obtaining informed consent, the colonoscope was                        passed under direct vision. Throughout the procedure,                        the patient's blood pressure, pulse, and oxygen                        saturations were monitored continuously. The                        Colonoscope was introduced through the anus and                        advanced to the the cecum, identified by appendiceal                        orifice and ileocecal valve. The colonoscopy was                        somewhat difficult due to a redundant colon. The                        patient tolerated the procedure well. The quality of                        the bowel preparation was good. Findings:      A localized area of granular mucosa was found in the transverse colon.       Biopsies were taken with a cold forceps for histology. Done to rule out       adenomatous tisssue.      Two  sessile polyps were found in the rectum. The polyps were diminutive       in size. These polyps were removed with a jumbo cold forceps. Resection       and  retrieval were complete.      A few medium-mouthed diverticula were found in the sigmoid colon.      Internal hemorrhoids were found during endoscopy. The hemorrhoids were       small and Grade I (internal hemorrhoids that do not prolapse). Impression:           - Granularity in the transverse colon. Biopsied.                       - Two diminutive polyps in the rectum, removed with a                        jumbo cold forceps. Resected and retrieved.                       - Diverticulosis in the sigmoid colon.                       - Internal hemorrhoids. Recommendation:       - Await pathology results. Manya Silvas, MD 12/21/2017 11:45:13 AM This report has been signed electronically. Number of Addenda: 0 Note Initiated On: 12/21/2017 11:03 AM Scope Withdrawal Time: 0 hours 11 minutes 44 seconds  Total Procedure Duration: 0 hours 17 minutes 35 seconds       [facility]

## 2017-12-21 NOTE — Transfer of Care (Signed)
Immediate Anesthesia Transfer of Care Note  Patient: Megan Hodge  Procedure(s) Performed: ESOPHAGOGASTRODUODENOSCOPY (EGD) WITH PROPOFOL (N/A ) COLONOSCOPY WITH PROPOFOL (N/A )  Patient Location: PACU and Endoscopy Unit  Anesthesia Type:General  Level of Consciousness: awake and patient cooperative  Airway & Oxygen Therapy: Patient Spontanous Breathing and Patient connected to nasal cannula oxygen  Post-op Assessment: Report given to RN and Post -op Vital signs reviewed and unstable, Anesthesiologist notified  Post vital signs: Reviewed and stable  Last Vitals:  Vitals Value Taken Time  BP 94/52 12/21/2017 12:06 PM  Temp 36.1 C 12/21/2017 12:06 PM  Pulse 67 12/21/2017 12:06 PM  Resp 16 12/21/2017 12:06 PM  SpO2 100 % 12/21/2017 12:06 PM  Vitals shown include unvalidated device data.  Last Pain:  Vitals:   12/21/17 1156  TempSrc:   PainSc: 0-No pain         Complications: No apparent anesthesia complications

## 2017-12-21 NOTE — Anesthesia Post-op Follow-up Note (Signed)
Anesthesia QCDR form completed.        

## 2017-12-21 NOTE — H&P (Signed)
Primary Care Physician:  Marinda Elk, MD Primary Gastroenterologist:  Dr. Vira Agar  Pre-Procedure History & Physical: HPI:  Megan Hodge is a 72 y.o. female is here for an endoscopy and colonoscopy.  Done for Barretts esophagus and colon cancer in her father and personal history of colon polyps in her.   Past Medical History:  Diagnosis Date  . Abnormal mammogram, unspecified    right breast  . Arthritis    lumbar spine  . Barrett's esophagus   . Cancer [facility]) 1968   cervical cancer  . Chronic kidney disease    renal cystic disease  . Gastroparesis 1999  . GERD (gastroesophageal reflux disease)   . Hypertension 1995    Past Surgical History:  Procedure Laterality Date  . ABDOMINAL HYSTERECTOMY  1993  . APPENDECTOMY  1993  . BREAST BIOPSY Right 1992  . BREAST BIOPSY Right March 2014   Atypical ductal hyperplasia. Failed trial of tamoxifen for chemoprevention.  . COLONOSCOPY  08/2014   Dr. Vira Agar  . DILATION AND CURETTAGE OF UTERUS  1968  . ESOPHAGOGASTRODUODENOSCOPY    . FRACTURE SURGERY Right 04/20/2016   arthroplasty radial head w/ORIF  . JOINT REPLACEMENT Right 04/2016    Prior to Admission medications   Medication Sig Start Date End Date Taking? Authorizing Provider  benazepril (LOTENSIN) 20 MG tablet Take 20 mg by mouth daily.   Yes [provider]  metoprolol succinate (TOPROL-XL) 25 MG 24 hr tablet  09/27/15  Yes [provider]  hydrochlorothiazide (MICROZIDE) 12.5 MG capsule  09/15/16   [provider]  Omeprazole (PRILOSEC PO) Take 20 mg by mouth daily.     [provider]    Allergies as of 11/03/2017 - Review Complete 09/28/2016  Allergen Reaction Noted  . Ivp dye [iodinated diagnostic agents]  09/20/2012    Family History  Problem Relation Age of Onset  . Cancer Mother 80       lung    Social History   Socioeconomic History  . Marital status: Married    Spouse name: Not on file  . Number of  children: Not on file  . Years of education: Not on file  . Highest education level: Not on file  Occupational History  . Not on file  Social Needs  . Financial resource strain: Not on file  . Food insecurity:    Worry: Not on file    Inability: Not on file  . Transportation needs:    Medical: Not on file    Non-medical: Not on file  Tobacco Use  . Smoking status: Never Smoker  . Smokeless tobacco: Never Used  Substance and Sexual Activity  . Alcohol use: No  . Drug use: No  . Sexual activity: Not on file  Lifestyle  . Physical activity:    Days per week: Not on file    Minutes per session: Not on file  . Stress: Not on file  Relationships  . Social connections:    Talks on phone: Not on file    Gets together: Not on file    Attends religious service: Not on file    Active member of club or organization: Not on file    Attends meetings of clubs or organizations: Not on file    Relationship status: Not on file  . Intimate partner violence:    Fear of current or ex partner: Not on file    Emotionally abused: Not on file    Physically abused: Not  on file    Forced sexual activity: Not on file  Other Topics Concern  . Not on file  Social History Narrative  . Not on file    Review of Systems: See HPI, otherwise negative ROS  Physical Exam: BP 118/82   Pulse 100   Temp (!) 97.2 F (36.2 C) (Tympanic)   Resp 18   Ht 5' 5.5" (1.664 m)   Wt 102.1 kg (225 lb)   SpO2 100%   BMI 36.87 kg/m  General:   Alert,  pleasant and cooperative in NAD Head:  Normocephalic and atraumatic. Neck:  Supple; no masses or thyromegaly. Lungs:  Clear throughout to auscultation.    Heart:  Regular rate and rhythm. Abdomen:  Soft, nontender and nondistended. Normal bowel sounds, without guarding, and without rebound.   Neurologic:  Alert and  oriented x4;  grossly normal neurologically.  Impression/Plan: Megan Hodge is here for an endoscopy and colonoscopy to be performed for  Colon cancer in father and [facility] colon polyps and Barretts esophagus.  Risks, benefits, limitations, and alternatives regarding  endoscopy and colonoscopy have been reviewed with the patient.  Questions have been answered.  All parties agreeable.   Gaylyn Cheers, MD  12/21/2017, 11:00 AM

## 2017-12-22 LAB — SURGICAL PATHOLOGY

## 2017-12-22 NOTE — Anesthesia Postprocedure Evaluation (Signed)
Anesthesia Post Note  Patient: Megan Hodge  Procedure(s) Performed: ESOPHAGOGASTRODUODENOSCOPY (EGD) WITH PROPOFOL (N/A ) COLONOSCOPY WITH PROPOFOL (N/A )  Patient location during evaluation: Endoscopy Anesthesia Type: General Level of consciousness: awake and alert Pain management: pain level controlled Vital Signs Assessment: post-procedure vital signs reviewed and stable Respiratory status: spontaneous breathing, nonlabored ventilation, respiratory function stable and patient connected to nasal cannula oxygen Cardiovascular status: blood pressure returned to baseline and stable Postop Assessment: no apparent nausea or vomiting Anesthetic complications: no     Last Vitals:  Vitals:   12/21/17 1214 12/21/17 1217  BP: 90/62 (!) 100/58  Pulse:    Resp:    Temp:    SpO2:      Last Pain:  Vitals:   12/22/17 0752  TempSrc:   PainSc: 0-No pain                 Martha Clan

## 2017-12-23 IMAGING — US US EXTREM LOW VENOUS BILAT
1 series · 13 of 24 positions shown · non-contrast
Comparison: None.

CLINICAL DATA: Right leg edema for approximately 1 month.



[Series 1: us extrem low venous bilat · 0.11mm/px · 13 of 69 slices shown]
[im 1/69]
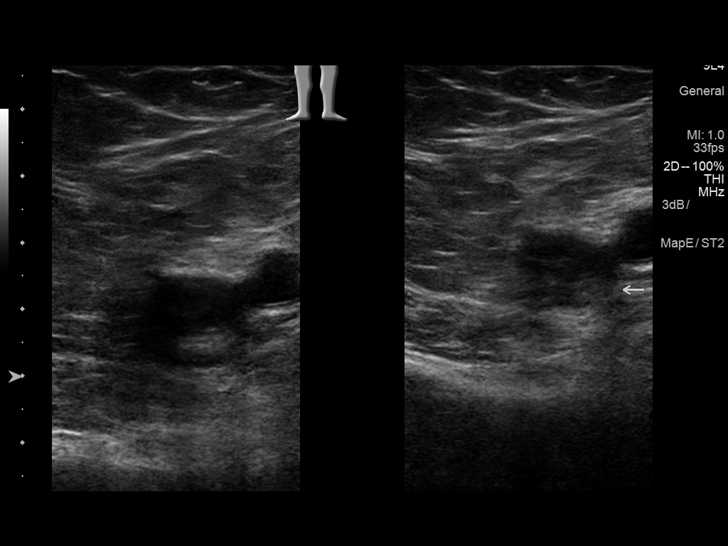
[im 6/69]
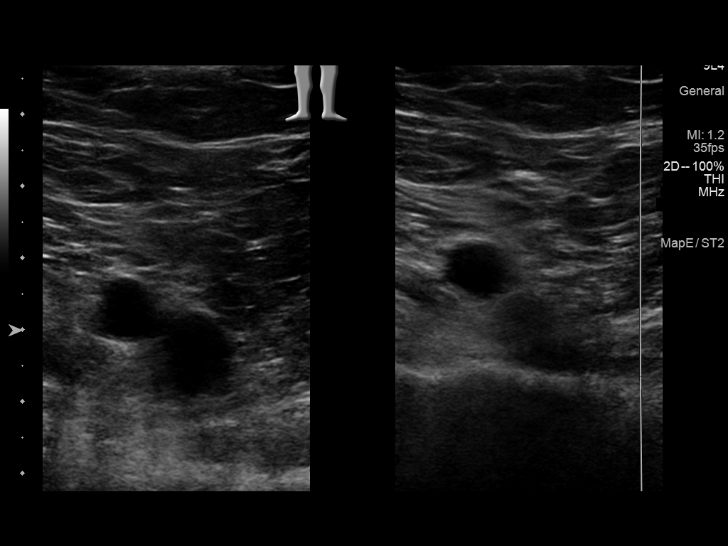
[im 12/69]
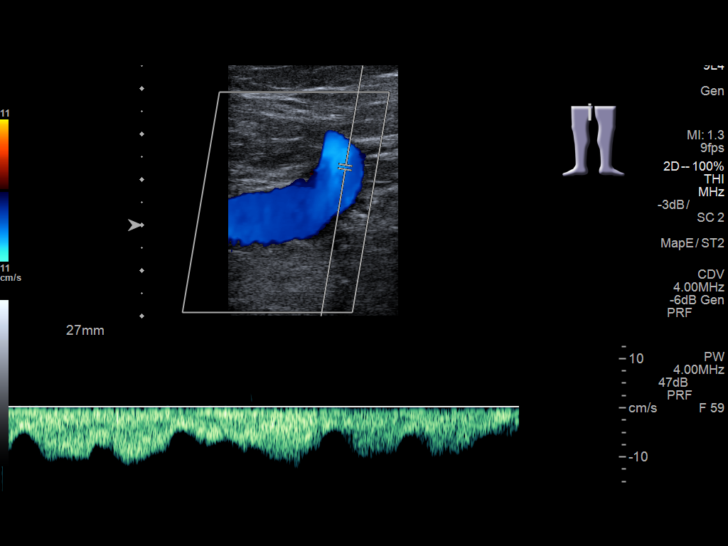
[im 18/69]
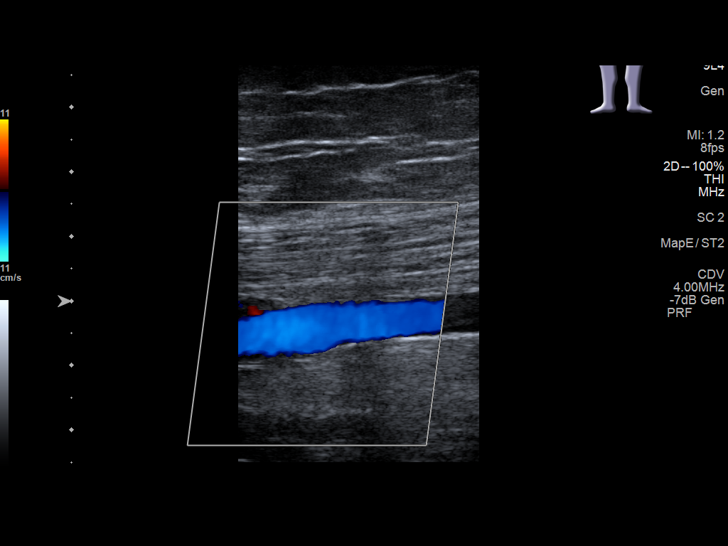
[im 24/69]
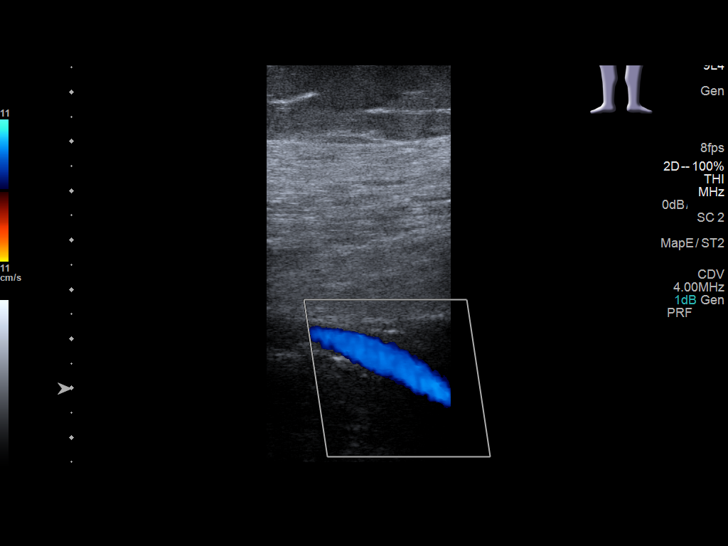
[im 30/69]
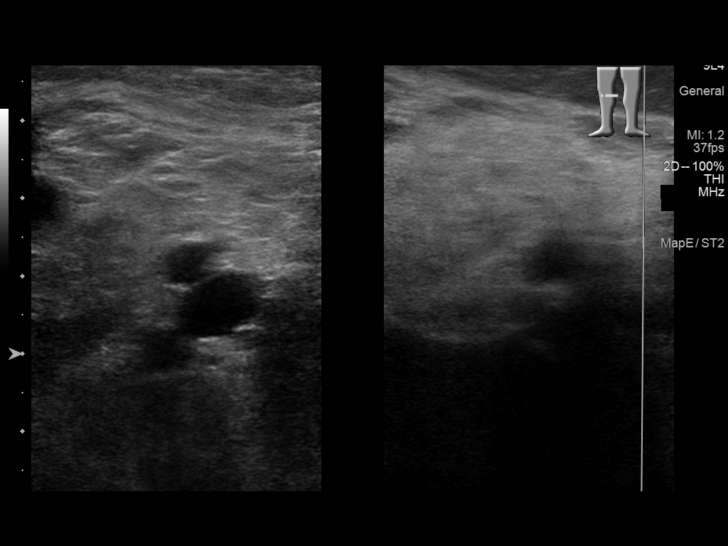
[im 36/69]
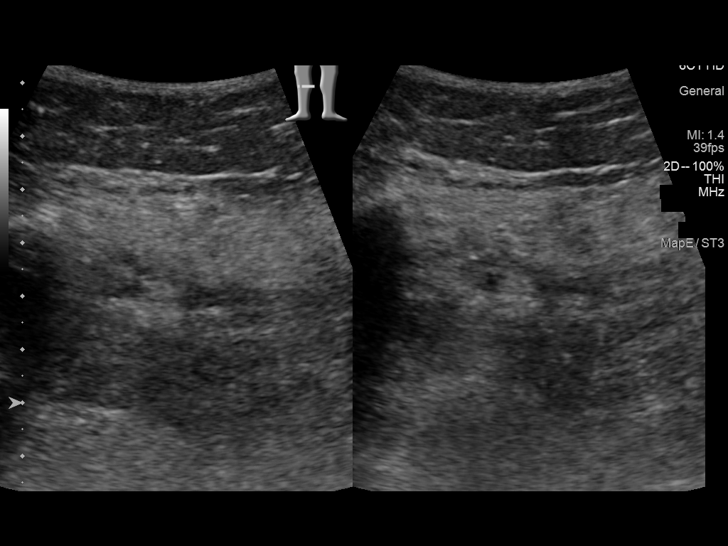
[im 39/69]
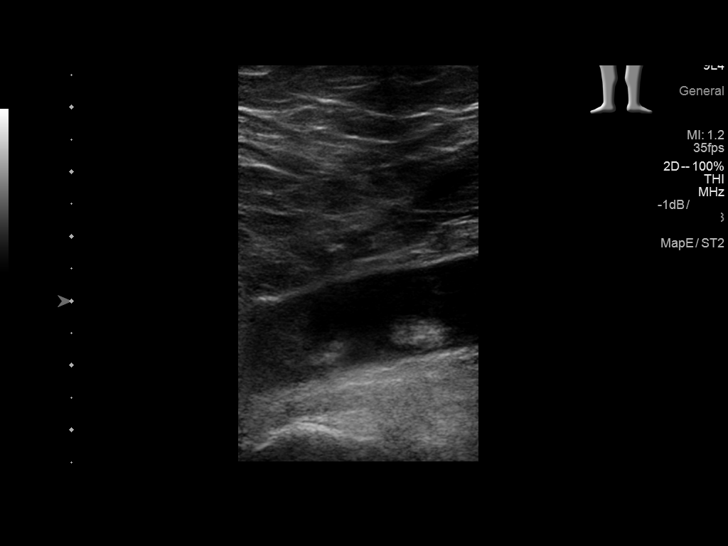
[im 45/69]
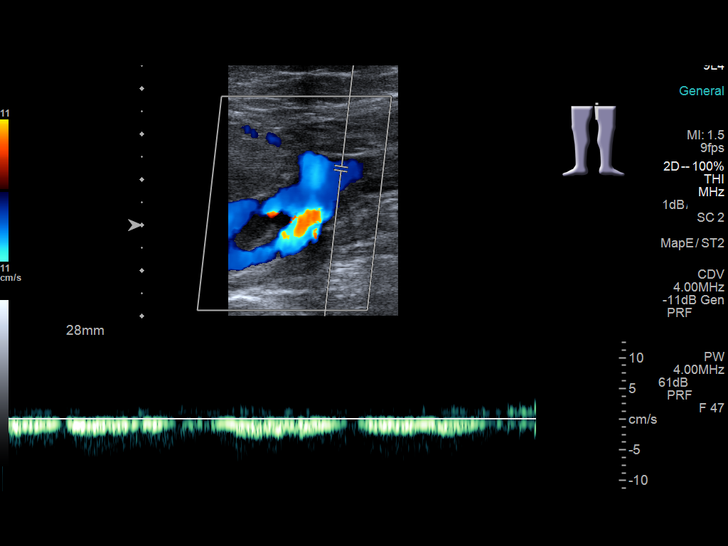
[im 51/69]
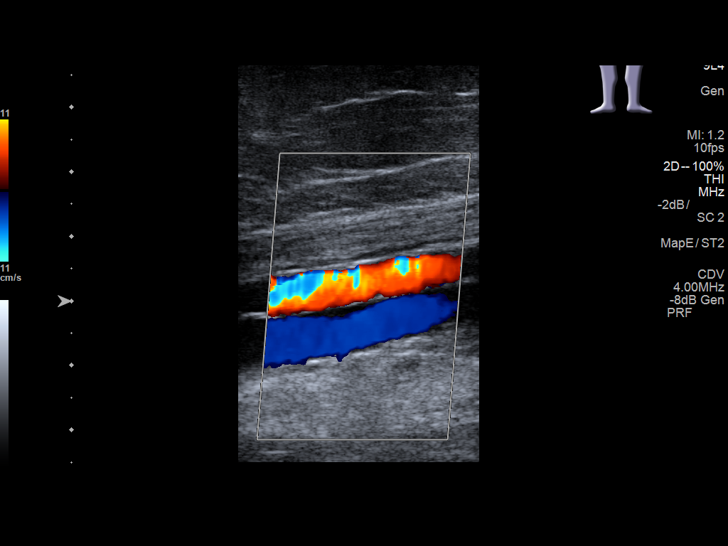
[im 57/69]
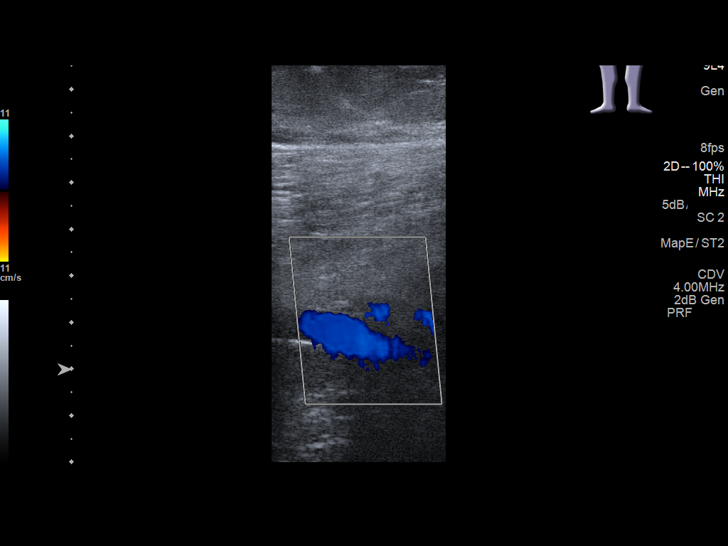
[im 63/69]
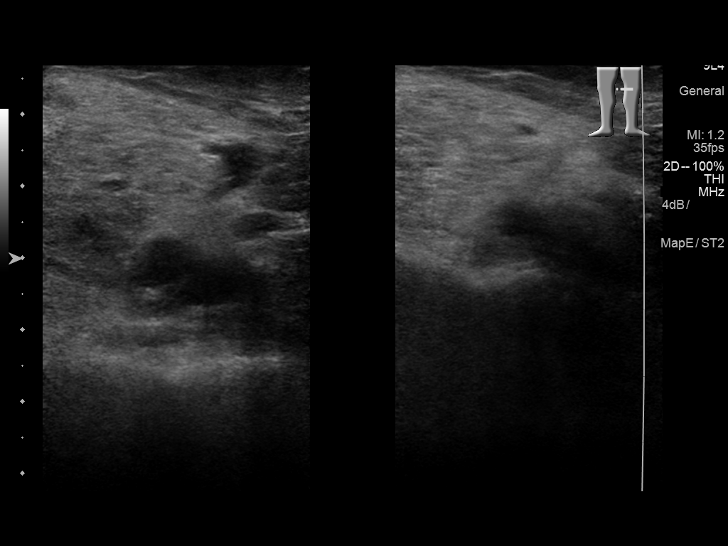
[im 69/69]
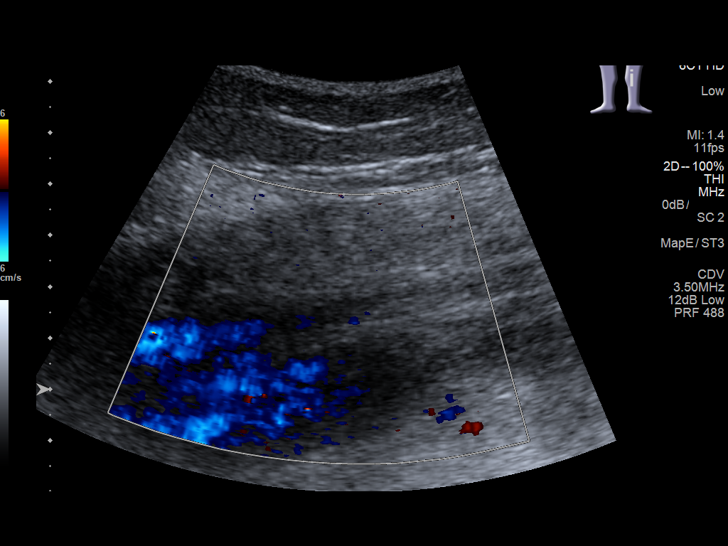

[13 of 24 positions shown; findings below may reference images not displayed]

FINDINGS: RIGHT LOWER EXTREMITY

Normal compressibility, augmentation and color Doppler flow in the
right common femoral vein, right femoral vein and right popliteal
vein. The right saphenofemoral junction is patent. Right profunda
femoral vein is patent without thrombus. There is occlusive thrombus
in one of the right posterior tibial veins. No evidence for a DVT in
the right peroneal veins.

LEFT LOWER EXTREMITY

There is nonocclusive echogenic thrombus in the left common femoral
vein. Partial compressibility of the left common femoral vein. The
left saphenofemoral junction is patent. Left profunda femoral vein
is patent without thrombus. Normal compressibility and color Doppler
flow in the left femoral vein. Normal compressibility and color
Doppler flow in the left popliteal vein. Visualized left deep calf
veins are patent without thrombus.
IMPRESSION: Positive for bilateral lower extremity deep venous thrombosis.

Nonocclusive thrombus in the left common femoral vein.

Occlusive thrombus in a right posterior tibial vein.

## 2017-12-27 ENCOUNTER — Encounter: Payer: Self-pay | Admitting: Unknown Physician Specialty

## 2018-03-31 DIAGNOSIS — R7303 Prediabetes: Secondary | ICD-10-CM | POA: Diagnosis not present

## 2018-03-31 DIAGNOSIS — I10 Essential (primary) hypertension: Secondary | ICD-10-CM | POA: Diagnosis not present

## 2018-03-31 DIAGNOSIS — E782 Mixed hyperlipidemia: Secondary | ICD-10-CM | POA: Diagnosis not present

## 2018-04-04 DIAGNOSIS — I10 Essential (primary) hypertension: Secondary | ICD-10-CM | POA: Diagnosis not present

## 2018-04-04 DIAGNOSIS — N183 Chronic kidney disease, stage 3 (moderate): Secondary | ICD-10-CM | POA: Diagnosis not present

## 2018-04-04 DIAGNOSIS — N6099 Unspecified benign mammary dysplasia of unspecified breast: Secondary | ICD-10-CM | POA: Diagnosis not present

## 2018-04-04 DIAGNOSIS — Z0000 Encounter for general adult medical examination without abnormal findings: Secondary | ICD-10-CM | POA: Diagnosis not present

## 2018-04-04 DIAGNOSIS — E782 Mixed hyperlipidemia: Secondary | ICD-10-CM | POA: Diagnosis not present

## 2018-04-04 DIAGNOSIS — K2270 Barrett's esophagus without dysplasia: Secondary | ICD-10-CM | POA: Diagnosis not present

## 2018-04-04 DIAGNOSIS — R7303 Prediabetes: Secondary | ICD-10-CM | POA: Diagnosis not present

## 2018-04-04 DIAGNOSIS — K219 Gastro-esophageal reflux disease without esophagitis: Secondary | ICD-10-CM | POA: Diagnosis not present

## 2018-04-24 DIAGNOSIS — R1032 Left lower quadrant pain: Secondary | ICD-10-CM | POA: Diagnosis not present

## 2018-04-24 DIAGNOSIS — N390 Urinary tract infection, site not specified: Secondary | ICD-10-CM | POA: Diagnosis not present

## 2018-04-24 DIAGNOSIS — R109 Unspecified abdominal pain: Secondary | ICD-10-CM | POA: Diagnosis not present

## 2018-09-26 DIAGNOSIS — Z1231 Encounter for screening mammogram for malignant neoplasm of breast: Secondary | ICD-10-CM | POA: Diagnosis not present

## 2019-03-29 DIAGNOSIS — N390 Urinary tract infection, site not specified: Secondary | ICD-10-CM | POA: Diagnosis not present

## 2019-03-29 DIAGNOSIS — E782 Mixed hyperlipidemia: Secondary | ICD-10-CM | POA: Diagnosis not present

## 2019-03-29 DIAGNOSIS — I10 Essential (primary) hypertension: Secondary | ICD-10-CM | POA: Diagnosis not present

## 2019-03-29 DIAGNOSIS — R7303 Prediabetes: Secondary | ICD-10-CM | POA: Diagnosis not present

## 2019-04-10 DIAGNOSIS — N6099 Unspecified benign mammary dysplasia of unspecified breast: Secondary | ICD-10-CM | POA: Diagnosis not present

## 2019-04-10 DIAGNOSIS — K2270 Barrett's esophagus without dysplasia: Secondary | ICD-10-CM | POA: Diagnosis not present

## 2019-04-10 DIAGNOSIS — Z0000 Encounter for general adult medical examination without abnormal findings: Secondary | ICD-10-CM | POA: Diagnosis not present

## 2019-04-10 DIAGNOSIS — E782 Mixed hyperlipidemia: Secondary | ICD-10-CM | POA: Diagnosis not present

## 2019-04-10 DIAGNOSIS — I10 Essential (primary) hypertension: Secondary | ICD-10-CM | POA: Diagnosis not present

## 2019-04-10 DIAGNOSIS — R7303 Prediabetes: Secondary | ICD-10-CM | POA: Diagnosis not present

## 2019-10-02 DIAGNOSIS — I10 Essential (primary) hypertension: Secondary | ICD-10-CM | POA: Diagnosis not present

## 2019-10-02 DIAGNOSIS — E782 Mixed hyperlipidemia: Secondary | ICD-10-CM | POA: Diagnosis not present

## 2019-10-09 DIAGNOSIS — Z0000 Encounter for general adult medical examination without abnormal findings: Secondary | ICD-10-CM | POA: Diagnosis not present

## 2019-10-09 DIAGNOSIS — I10 Essential (primary) hypertension: Secondary | ICD-10-CM | POA: Diagnosis not present

## 2019-10-09 DIAGNOSIS — N6099 Unspecified benign mammary dysplasia of unspecified breast: Secondary | ICD-10-CM | POA: Diagnosis not present

## 2019-10-09 DIAGNOSIS — R7303 Prediabetes: Secondary | ICD-10-CM | POA: Diagnosis not present

## 2019-10-09 DIAGNOSIS — E782 Mixed hyperlipidemia: Secondary | ICD-10-CM | POA: Diagnosis not present

## 2019-10-09 DIAGNOSIS — N183 Chronic kidney disease, stage 3 unspecified: Secondary | ICD-10-CM | POA: Diagnosis not present

## 2019-10-09 DIAGNOSIS — K2270 Barrett's esophagus without dysplasia: Secondary | ICD-10-CM | POA: Diagnosis not present

## 2019-10-09 DIAGNOSIS — Z1239 Encounter for other screening for malignant neoplasm of breast: Secondary | ICD-10-CM | POA: Diagnosis not present

## 2019-11-14 DIAGNOSIS — Z1231 Encounter for screening mammogram for malignant neoplasm of breast: Secondary | ICD-10-CM | POA: Diagnosis not present

## 2019-11-15 DIAGNOSIS — H2513 Age-related nuclear cataract, bilateral: Secondary | ICD-10-CM | POA: Diagnosis not present

## 2020-04-03 DIAGNOSIS — I10 Essential (primary) hypertension: Secondary | ICD-10-CM | POA: Diagnosis not present

## 2020-04-03 DIAGNOSIS — R7303 Prediabetes: Secondary | ICD-10-CM | POA: Diagnosis not present

## 2020-04-03 DIAGNOSIS — E782 Mixed hyperlipidemia: Secondary | ICD-10-CM | POA: Diagnosis not present

## 2020-04-10 DIAGNOSIS — K2270 Barrett's esophagus without dysplasia: Secondary | ICD-10-CM | POA: Diagnosis not present

## 2020-04-10 DIAGNOSIS — N6099 Unspecified benign mammary dysplasia of unspecified breast: Secondary | ICD-10-CM | POA: Diagnosis not present

## 2020-04-10 DIAGNOSIS — I10 Essential (primary) hypertension: Secondary | ICD-10-CM | POA: Diagnosis not present

## 2020-04-10 DIAGNOSIS — K219 Gastro-esophageal reflux disease without esophagitis: Secondary | ICD-10-CM | POA: Diagnosis not present

## 2020-04-10 DIAGNOSIS — E782 Mixed hyperlipidemia: Secondary | ICD-10-CM | POA: Diagnosis not present

## 2020-04-10 DIAGNOSIS — R7303 Prediabetes: Secondary | ICD-10-CM | POA: Diagnosis not present

## 2020-04-10 DIAGNOSIS — Z0000 Encounter for general adult medical examination without abnormal findings: Secondary | ICD-10-CM | POA: Diagnosis not present

## 2020-10-01 DIAGNOSIS — E782 Mixed hyperlipidemia: Secondary | ICD-10-CM | POA: Diagnosis not present

## 2020-10-01 DIAGNOSIS — R7303 Prediabetes: Secondary | ICD-10-CM | POA: Diagnosis not present

## 2020-10-01 DIAGNOSIS — I10 Essential (primary) hypertension: Secondary | ICD-10-CM | POA: Diagnosis not present

## 2020-10-08 DIAGNOSIS — I10 Essential (primary) hypertension: Secondary | ICD-10-CM | POA: Diagnosis not present

## 2020-10-08 DIAGNOSIS — R7303 Prediabetes: Secondary | ICD-10-CM | POA: Diagnosis not present

## 2020-10-08 DIAGNOSIS — E782 Mixed hyperlipidemia: Secondary | ICD-10-CM | POA: Diagnosis not present

## 2020-10-08 DIAGNOSIS — Z0000 Encounter for general adult medical examination without abnormal findings: Secondary | ICD-10-CM | POA: Diagnosis not present

## 2020-10-08 DIAGNOSIS — K2270 Barrett's esophagus without dysplasia: Secondary | ICD-10-CM | POA: Diagnosis not present

## 2020-10-08 DIAGNOSIS — N6099 Unspecified benign mammary dysplasia of unspecified breast: Secondary | ICD-10-CM | POA: Diagnosis not present

## 2020-10-08 DIAGNOSIS — K219 Gastro-esophageal reflux disease without esophagitis: Secondary | ICD-10-CM | POA: Diagnosis not present

## 2020-11-14 DIAGNOSIS — Z1231 Encounter for screening mammogram for malignant neoplasm of breast: Secondary | ICD-10-CM | POA: Diagnosis not present

## 2020-11-20 DIAGNOSIS — H2513 Age-related nuclear cataract, bilateral: Secondary | ICD-10-CM | POA: Diagnosis not present

## 2021-04-06 DIAGNOSIS — R7303 Prediabetes: Secondary | ICD-10-CM | POA: Diagnosis not present

## 2021-04-06 DIAGNOSIS — I10 Essential (primary) hypertension: Secondary | ICD-10-CM | POA: Diagnosis not present

## 2021-04-06 DIAGNOSIS — E782 Mixed hyperlipidemia: Secondary | ICD-10-CM | POA: Diagnosis not present

## 2021-04-15 DIAGNOSIS — E782 Mixed hyperlipidemia: Secondary | ICD-10-CM | POA: Diagnosis not present

## 2021-04-15 DIAGNOSIS — K2270 Barrett's esophagus without dysplasia: Secondary | ICD-10-CM | POA: Diagnosis not present

## 2021-04-15 DIAGNOSIS — I10 Essential (primary) hypertension: Secondary | ICD-10-CM | POA: Diagnosis not present

## 2021-04-15 DIAGNOSIS — Z0000 Encounter for general adult medical examination without abnormal findings: Secondary | ICD-10-CM | POA: Diagnosis not present

## 2021-04-15 DIAGNOSIS — R7303 Prediabetes: Secondary | ICD-10-CM | POA: Diagnosis not present

## 2021-04-15 DIAGNOSIS — K219 Gastro-esophageal reflux disease without esophagitis: Secondary | ICD-10-CM | POA: Diagnosis not present

## 2021-10-08 DIAGNOSIS — R7303 Prediabetes: Secondary | ICD-10-CM | POA: Diagnosis not present

## 2021-10-08 DIAGNOSIS — I10 Essential (primary) hypertension: Secondary | ICD-10-CM | POA: Diagnosis not present

## 2021-10-08 DIAGNOSIS — E782 Mixed hyperlipidemia: Secondary | ICD-10-CM | POA: Diagnosis not present

## 2021-10-08 DIAGNOSIS — Z0000 Encounter for general adult medical examination without abnormal findings: Secondary | ICD-10-CM | POA: Diagnosis not present

## 2021-10-08 DIAGNOSIS — K219 Gastro-esophageal reflux disease without esophagitis: Secondary | ICD-10-CM | POA: Diagnosis not present

## 2021-10-15 DIAGNOSIS — Z1231 Encounter for screening mammogram for malignant neoplasm of breast: Secondary | ICD-10-CM | POA: Diagnosis not present

## 2021-10-15 DIAGNOSIS — N6099 Unspecified benign mammary dysplasia of unspecified breast: Secondary | ICD-10-CM | POA: Diagnosis not present

## 2021-10-15 DIAGNOSIS — Z0000 Encounter for general adult medical examination without abnormal findings: Secondary | ICD-10-CM | POA: Diagnosis not present

## 2021-10-15 DIAGNOSIS — E782 Mixed hyperlipidemia: Secondary | ICD-10-CM | POA: Diagnosis not present

## 2021-10-15 DIAGNOSIS — K219 Gastro-esophageal reflux disease without esophagitis: Secondary | ICD-10-CM | POA: Diagnosis not present

## 2021-10-15 DIAGNOSIS — R7303 Prediabetes: Secondary | ICD-10-CM | POA: Diagnosis not present

## 2021-10-15 DIAGNOSIS — I10 Essential (primary) hypertension: Secondary | ICD-10-CM | POA: Diagnosis not present

## 2021-11-16 DIAGNOSIS — Z1231 Encounter for screening mammogram for malignant neoplasm of breast: Secondary | ICD-10-CM | POA: Diagnosis not present

## 2021-11-26 DIAGNOSIS — H524 Presbyopia: Secondary | ICD-10-CM | POA: Diagnosis not present

## 2021-12-11 DIAGNOSIS — W010XXA Fall on same level from slipping, tripping and stumbling without subsequent striking against object, initial encounter: Secondary | ICD-10-CM | POA: Diagnosis not present

## 2021-12-11 DIAGNOSIS — M25572 Pain in left ankle and joints of left foot: Secondary | ICD-10-CM | POA: Diagnosis not present

## 2021-12-11 DIAGNOSIS — Y92009 Unspecified place in unspecified non-institutional (private) residence as the place of occurrence of the external cause: Secondary | ICD-10-CM | POA: Diagnosis not present

## 2021-12-15 DIAGNOSIS — S82832A Other fracture of upper and lower end of left fibula, initial encounter for closed fracture: Secondary | ICD-10-CM | POA: Diagnosis not present

## 2021-12-15 DIAGNOSIS — S8255XA Nondisplaced fracture of medial malleolus of left tibia, initial encounter for closed fracture: Secondary | ICD-10-CM | POA: Diagnosis not present

## 2022-01-06 DIAGNOSIS — S82832D Other fracture of upper and lower end of left fibula, subsequent encounter for closed fracture with routine healing: Secondary | ICD-10-CM | POA: Diagnosis not present

## 2022-01-06 DIAGNOSIS — S82832A Other fracture of upper and lower end of left fibula, initial encounter for closed fracture: Secondary | ICD-10-CM | POA: Diagnosis not present

## 2022-02-05 DIAGNOSIS — S82832D Other fracture of upper and lower end of left fibula, subsequent encounter for closed fracture with routine healing: Secondary | ICD-10-CM | POA: Diagnosis not present

## 2022-02-16 DIAGNOSIS — M8588 Other specified disorders of bone density and structure, other site: Secondary | ICD-10-CM | POA: Diagnosis not present

## 2022-04-12 DIAGNOSIS — E782 Mixed hyperlipidemia: Secondary | ICD-10-CM | POA: Diagnosis not present

## 2022-04-12 DIAGNOSIS — I10 Essential (primary) hypertension: Secondary | ICD-10-CM | POA: Diagnosis not present

## 2022-04-12 DIAGNOSIS — R7303 Prediabetes: Secondary | ICD-10-CM | POA: Diagnosis not present

## 2022-04-19 DIAGNOSIS — E782 Mixed hyperlipidemia: Secondary | ICD-10-CM | POA: Diagnosis not present

## 2022-04-19 DIAGNOSIS — R7303 Prediabetes: Secondary | ICD-10-CM | POA: Diagnosis not present

## 2022-04-19 DIAGNOSIS — Z0000 Encounter for general adult medical examination without abnormal findings: Secondary | ICD-10-CM | POA: Diagnosis not present

## 2022-04-19 DIAGNOSIS — I10 Essential (primary) hypertension: Secondary | ICD-10-CM | POA: Diagnosis not present

## 2022-04-19 DIAGNOSIS — N6099 Unspecified benign mammary dysplasia of unspecified breast: Secondary | ICD-10-CM | POA: Diagnosis not present

## 2022-10-12 DIAGNOSIS — E782 Mixed hyperlipidemia: Secondary | ICD-10-CM | POA: Diagnosis not present

## 2022-10-12 DIAGNOSIS — R7303 Prediabetes: Secondary | ICD-10-CM | POA: Diagnosis not present

## 2022-10-12 DIAGNOSIS — I10 Essential (primary) hypertension: Secondary | ICD-10-CM | POA: Diagnosis not present

## 2022-10-19 DIAGNOSIS — R7303 Prediabetes: Secondary | ICD-10-CM | POA: Diagnosis not present

## 2022-10-19 DIAGNOSIS — Z780 Asymptomatic menopausal state: Secondary | ICD-10-CM | POA: Diagnosis not present

## 2022-10-19 DIAGNOSIS — Z23 Encounter for immunization: Secondary | ICD-10-CM | POA: Diagnosis not present

## 2022-10-19 DIAGNOSIS — Z0000 Encounter for general adult medical examination without abnormal findings: Secondary | ICD-10-CM | POA: Diagnosis not present

## 2022-10-19 DIAGNOSIS — Z1231 Encounter for screening mammogram for malignant neoplasm of breast: Secondary | ICD-10-CM | POA: Diagnosis not present

## 2022-10-19 DIAGNOSIS — N1831 Chronic kidney disease, stage 3a: Secondary | ICD-10-CM | POA: Diagnosis not present

## 2022-10-19 DIAGNOSIS — E782 Mixed hyperlipidemia: Secondary | ICD-10-CM | POA: Diagnosis not present

## 2022-10-19 DIAGNOSIS — J301 Allergic rhinitis due to pollen: Secondary | ICD-10-CM | POA: Diagnosis not present

## 2022-10-19 DIAGNOSIS — N6099 Unspecified benign mammary dysplasia of unspecified breast: Secondary | ICD-10-CM | POA: Diagnosis not present

## 2022-10-19 DIAGNOSIS — Z03818 Encounter for observation for suspected exposure to other biological agents ruled out: Secondary | ICD-10-CM | POA: Diagnosis not present

## 2022-10-19 DIAGNOSIS — I10 Essential (primary) hypertension: Secondary | ICD-10-CM | POA: Diagnosis not present

## 2022-10-19 DIAGNOSIS — J069 Acute upper respiratory infection, unspecified: Secondary | ICD-10-CM | POA: Diagnosis not present

## 2022-11-18 DIAGNOSIS — Z1231 Encounter for screening mammogram for malignant neoplasm of breast: Secondary | ICD-10-CM | POA: Diagnosis not present

## 2022-12-02 DIAGNOSIS — H2513 Age-related nuclear cataract, bilateral: Secondary | ICD-10-CM | POA: Diagnosis not present

## 2022-12-02 DIAGNOSIS — H43813 Vitreous degeneration, bilateral: Secondary | ICD-10-CM | POA: Diagnosis not present

## 2022-12-02 DIAGNOSIS — H43393 Other vitreous opacities, bilateral: Secondary | ICD-10-CM | POA: Diagnosis not present

## 2023-04-14 DIAGNOSIS — I10 Essential (primary) hypertension: Secondary | ICD-10-CM | POA: Diagnosis not present

## 2023-04-14 DIAGNOSIS — E782 Mixed hyperlipidemia: Secondary | ICD-10-CM | POA: Diagnosis not present

## 2023-04-14 DIAGNOSIS — N1831 Chronic kidney disease, stage 3a: Secondary | ICD-10-CM | POA: Diagnosis not present

## 2023-04-14 DIAGNOSIS — R7303 Prediabetes: Secondary | ICD-10-CM | POA: Diagnosis not present

## 2023-04-21 DIAGNOSIS — Z780 Asymptomatic menopausal state: Secondary | ICD-10-CM | POA: Diagnosis not present

## 2023-04-21 DIAGNOSIS — I10 Essential (primary) hypertension: Secondary | ICD-10-CM | POA: Diagnosis not present

## 2023-04-21 DIAGNOSIS — E782 Mixed hyperlipidemia: Secondary | ICD-10-CM | POA: Diagnosis not present

## 2023-04-21 DIAGNOSIS — N6099 Unspecified benign mammary dysplasia of unspecified breast: Secondary | ICD-10-CM | POA: Diagnosis not present

## 2023-04-21 DIAGNOSIS — R7303 Prediabetes: Secondary | ICD-10-CM | POA: Diagnosis not present

## 2023-04-21 DIAGNOSIS — Z0000 Encounter for general adult medical examination without abnormal findings: Secondary | ICD-10-CM | POA: Diagnosis not present

## 2023-04-21 DIAGNOSIS — Z1211 Encounter for screening for malignant neoplasm of colon: Secondary | ICD-10-CM | POA: Diagnosis not present

## 2023-06-08 DIAGNOSIS — H538 Other visual disturbances: Secondary | ICD-10-CM | POA: Diagnosis not present

## 2023-06-08 DIAGNOSIS — H43813 Vitreous degeneration, bilateral: Secondary | ICD-10-CM | POA: Diagnosis not present

## 2023-06-08 DIAGNOSIS — Z0100 Encounter for examination of eyes and vision without abnormal findings: Secondary | ICD-10-CM | POA: Diagnosis not present

## 2023-06-08 DIAGNOSIS — H2513 Age-related nuclear cataract, bilateral: Secondary | ICD-10-CM | POA: Diagnosis not present

## 2023-06-08 DIAGNOSIS — D369 Benign neoplasm, unspecified site: Secondary | ICD-10-CM | POA: Diagnosis not present

## 2023-06-22 DIAGNOSIS — H2511 Age-related nuclear cataract, right eye: Secondary | ICD-10-CM | POA: Diagnosis not present

## 2023-06-29 ENCOUNTER — Encounter: Payer: Self-pay | Admitting: Ophthalmology

## 2023-07-04 NOTE — Anesthesia Preprocedure Evaluation (Signed)
Anesthesia Evaluation    Airway        Dental   Pulmonary           Cardiovascular hypertension,      Neuro/Psych    GI/Hepatic   Endo/Other    Renal/GU      Musculoskeletal   Abdominal   Peds  Hematology   Anesthesia Other Findings Hypertension  Cancer (HCC) Abnormal mammogram, unspecified Barrett's esophagus Chronic kidney disease  Arthritis Gastroparesis  GERD (gastroesophageal reflux disease) Complication of anesthesia--slow to awaken    Reproductive/Obstetrics                              Anesthesia Physical Anesthesia Plan Anesthesia Quick Evaluation

## 2023-07-14 NOTE — Discharge Instructions (Signed)

## 2023-07-18 ENCOUNTER — Encounter
Admission: RE | Disposition: A | Discharge status: Home / Self Care | Payer: Self-pay | Source: Home / Self Care | Attending: Ophthalmology

## 2023-07-18 ENCOUNTER — Ambulatory Visit: Payer: PPO | Admitting: Anesthesiology

## 2023-07-18 ENCOUNTER — Ambulatory Visit
Admission: RE | Admit: 2023-07-18 | Discharge: 2023-07-18 | Disposition: A | Discharge status: Home / Self Care | Payer: PPO | Attending: Ophthalmology | Admitting: Ophthalmology

## 2023-07-18 ENCOUNTER — Encounter: Payer: Self-pay | Admitting: Ophthalmology

## 2023-07-18 ENCOUNTER — Other Ambulatory Visit: Payer: Self-pay

## 2023-07-18 DIAGNOSIS — H2511 Age-related nuclear cataract, right eye: Secondary | ICD-10-CM | POA: Diagnosis not present

## 2023-07-18 DIAGNOSIS — K219 Gastro-esophageal reflux disease without esophagitis: Secondary | ICD-10-CM | POA: Insufficient documentation

## 2023-07-18 DIAGNOSIS — I129 Hypertensive chronic kidney disease with stage 1 through stage 4 chronic kidney disease, or unspecified chronic kidney disease: Secondary | ICD-10-CM | POA: Insufficient documentation

## 2023-07-18 DIAGNOSIS — H25811 Combined forms of age-related cataract, right eye: Secondary | ICD-10-CM | POA: Diagnosis not present

## 2023-07-18 DIAGNOSIS — N189 Chronic kidney disease, unspecified: Secondary | ICD-10-CM | POA: Insufficient documentation

## 2023-07-18 HISTORY — PX: CATARACT EXTRACTION W/PHACO: SHX586

## 2023-07-18 HISTORY — DX: Other complications of anesthesia, initial encounter: T88.59XA

## 2023-07-18 SURGERY — PHACOEMULSIFICATION, CATARACT, WITH IOL INSERTION
Anesthesia: Monitor Anesthesia Care | Site: Eye | Laterality: Right

## 2023-07-18 MED ORDER — TETRACAINE HCL 0.5 % OP SOLN
1.0000 [drp] | OPHTHALMIC | Status: DC | PRN
  Administered 2023-07-18 (×3): 1 [drp] via OPHTHALMIC

## 2023-07-18 MED ORDER — TETRACAINE HCL 0.5 % OP SOLN
OPHTHALMIC | Status: AC
  Filled 2023-07-18: qty 4

## 2023-07-18 MED ORDER — MOXIFLOXACIN HCL 0.5 % OP SOLN
OPHTHALMIC | Status: DC | PRN
  Administered 2023-07-18: .2 mL via OPHTHALMIC

## 2023-07-18 MED ORDER — SIGHTPATH DOSE#1 NA HYALUR & NA CHOND-NA HYALUR IO KIT
PACK | INTRAOCULAR | Status: DC | PRN
  Administered 2023-07-18: 1 via OPHTHALMIC

## 2023-07-18 MED ORDER — FENTANYL CITRATE (PF) 100 MCG/2ML IJ SOLN
INTRAMUSCULAR | Status: DC | PRN
  Administered 2023-07-18: 50 ug via INTRAVENOUS

## 2023-07-18 MED ORDER — ARMC OPHTHALMIC DILATING DROPS
1.0000 | OPHTHALMIC | Status: DC | PRN
  Administered 2023-07-18 (×3): 1 via OPHTHALMIC

## 2023-07-18 MED ORDER — LIDOCAINE HCL (PF) 2 % IJ SOLN
INTRAOCULAR | Status: DC | PRN
  Administered 2023-07-18: 1 mL via INTRAOCULAR

## 2023-07-18 MED ORDER — FENTANYL CITRATE (PF) 100 MCG/2ML IJ SOLN
INTRAMUSCULAR | Status: AC
  Filled 2023-07-18: qty 2

## 2023-07-18 MED ORDER — MIDAZOLAM HCL 2 MG/2ML IJ SOLN
INTRAMUSCULAR | Status: AC
  Filled 2023-07-18: qty 2

## 2023-07-18 MED ORDER — MIDAZOLAM HCL 2 MG/2ML IJ SOLN
INTRAMUSCULAR | Status: DC | PRN
  Administered 2023-07-18 (×2): 1 mg via INTRAVENOUS

## 2023-07-18 MED ORDER — SIGHTPATH DOSE#1 BSS IO SOLN
INTRAOCULAR | Status: DC | PRN
  Administered 2023-07-18: 92 mL via OPHTHALMIC

## 2023-07-18 MED ORDER — ARMC OPHTHALMIC DILATING DROPS
OPHTHALMIC | Status: AC
  Filled 2023-07-18: qty 0.5

## 2023-07-18 MED ORDER — SIGHTPATH DOSE#1 BSS IO SOLN
INTRAOCULAR | Status: DC | PRN
  Administered 2023-07-18: 15 mL

## 2023-07-18 SURGICAL SUPPLY — 16 items
CANNULA ANT/CHMB 27G (MISCELLANEOUS) IMPLANT
CANNULA ANT/CHMB 27GA (MISCELLANEOUS)
CATARACT SUITE SIGHTPATH (MISCELLANEOUS) ×1
DISSECTOR HYDRO NUCLEUS 50X22 (MISCELLANEOUS) ×1 IMPLANT
FEE CATARACT SUITE SIGHTPATH (MISCELLANEOUS) ×1 IMPLANT
GLOVE PI ULTRA LF STRL 7.5 (GLOVE) ×1 IMPLANT
GLOVE SURG POLYISOPRENE 8.5 (GLOVE) ×1
GLOVE SURG SYN 8.5 PF PI BL (GLOVE) ×1 IMPLANT
LENS IOL TECNIS EYHANCE 13.0 (Intraocular Lens) IMPLANT
NDL FILTER BLUNT 18X1 1/2 (NEEDLE) ×1 IMPLANT
NEEDLE FILTER BLUNT 18X1 1/2 (NEEDLE) ×1
PACK VIT ANT 23G (MISCELLANEOUS) IMPLANT
RING MALYGIN (MISCELLANEOUS) IMPLANT
SUT ETHILON 10-0 CS-B-6CS-B-6 (SUTURE)
SUTURE EHLN 10-0 CS-B-6CS-B-6 (SUTURE) IMPLANT
SYR 3ML LL SCALE MARK (SYRINGE) ×1 IMPLANT

## 2023-07-18 NOTE — Op Note (Signed)
 OPERATIVE NOTE  Megan Hodge [id number] 07/18/2023   PREOPERATIVE DIAGNOSIS:  Nuclear sclerotic cataract right eye.  H25.11   POSTOPERATIVE DIAGNOSIS:    Nuclear sclerotic cataract right eye.     PROCEDURE:  Phacoemusification with posterior chamber intraocular lens placement of the right eye   LENS:   Implant Name Type Inv. Item Serial No. Manufacturer Lot No. LRB No. Used Action  LENS IOL TECNIS EYHANCE 13.0 - [id number] Intraocular Lens LENS IOL TECNIS EYHANCE 13.0 [id number] [employer]  Right 1 Implanted       Procedure(s) with comments: CATARACT EXTRACTION PHACO AND INTRAOCULAR LENS PLACEMENT (IOC) RIGHT (Right) - 10.23 0:54.5  SURGEON:  Adine Novak, MD, MPH  ANESTHESIOLOGIST: Anesthesiologist: Ola Donny BROCKS, MD CRNA: Jahoo, Sonia, CRNA   ANESTHESIA:  Topical with tetracaine  drops augmented with 1% preservative-free intracameral lidocaine .  ESTIMATED BLOOD LOSS: less than 1 mL.   COMPLICATIONS:  None.   DESCRIPTION OF PROCEDURE:  The patient was identified in the holding room and transported to the operating room and placed in the supine position under the operating microscope.  The right eye was identified as the operative eye and it was prepped and draped in the usual sterile ophthalmic fashion.   A 1.0 millimeter clear-corneal paracentesis was made at the 10:30 position. 0.5 ml of preservative-free 1% lidocaine  with epinephrine  was injected into the anterior chamber.  The anterior chamber was filled with viscoelastic.  A 2.4 millimeter keratome was used to make a near-clear corneal incision at the 8:00 position.  A curvilinear capsulorrhexis was made with a cystotome and capsulorrhexis forceps.  Balanced salt  solution was used to hydrodissect and hydrodelineate the nucleus.   Phacoemulsification was then used in stop and chop fashion to remove the lens nucleus and epinucleus.  The remaining cortex was then removed using the irrigation and aspiration handpiece.  Viscoelastic was then placed into the capsular bag to distend it for lens placement.  A lens was then injected into the capsular bag.  The remaining viscoelastic was aspirated.   Wounds were hydrated with balanced salt  solution.  The anterior chamber was inflated to a physiologic pressure with balanced salt  solution.   Intracameral vigamox  0.1 mL undiluted was injected into the eye and a drop placed onto the ocular surface.  No wound leaks were noted.  The patient was taken to the recovery room in stable condition without complications of anesthesia or surgery  Adine Novak 07/18/2023, 10:25 AM

## 2023-07-18 NOTE — Transfer of Care (Signed)
 Immediate Anesthesia Transfer of Care Note  Patient: Megan Hodge  Procedure(s) Performed: CATARACT EXTRACTION PHACO AND INTRAOCULAR LENS PLACEMENT (IOC) RIGHT (Right: Eye)  Patient Location: PACU  Anesthesia Type: MAC  Level of Consciousness: awake, alert  and patient cooperative  Airway and Oxygen  Therapy: Patient Spontanous Breathing and Patient connected to supplemental oxygen   Post-op Assessment: Post-op Vital signs reviewed, Patient's Cardiovascular Status Stable, Respiratory Function Stable, Patent Airway and No signs of Nausea or vomiting  Post-op Vital Signs: Reviewed and stable  Complications: No notable events documented.

## 2023-07-18 NOTE — Anesthesia Postprocedure Evaluation (Signed)
 Anesthesia Post Note  Patient: Megan Hodge  Procedure(s) Performed: CATARACT EXTRACTION PHACO AND INTRAOCULAR LENS PLACEMENT (IOC) RIGHT (Right: Eye)  Patient location during evaluation: PACU Anesthesia Type: MAC Level of consciousness: awake and alert Pain management: pain level controlled Vital Signs Assessment: post-procedure vital signs reviewed and stable Respiratory status: spontaneous breathing, nonlabored ventilation, respiratory function stable and patient connected to nasal cannula oxygen  Cardiovascular status: stable and blood pressure returned to baseline Postop Assessment: no apparent nausea or vomiting Anesthetic complications: no   No notable events documented.   Last Vitals:  Vitals:   07/18/23 1026 07/18/23 1033  BP: 120/64 120/80  Pulse: 85 78  Resp: 18 (!) 23  Temp: (!) 36.4 C 36.5 C  SpO2: 95% 95%    Last Pain:  Vitals:   07/18/23 1033  TempSrc:   PainSc: 0-No pain                 Megan Hodge

## 2023-07-18 NOTE — H&P (Signed)
 [facility]   Primary Care Physician:  Marikay Eva POUR, MD Ophthalmologist: Dr. Adine Novak  Pre-Procedure History & Physical: HPI:  Megan Hodge is a 78 y.o. female here for cataract surgery.   Past Medical History:  Diagnosis Date   Abnormal mammogram, unspecified    right breast   Arthritis    lumbar spine   Barrett's esophagus    Cancer (HCC) 1968   cervical cancer   Chronic kidney disease    renal cystic disease   Complication of anesthesia    Slow to wake   Gastroparesis 1999   GERD (gastroesophageal reflux disease)    Hypertension 1995    Past Surgical History:  Procedure Laterality Date   ABDOMINAL HYSTERECTOMY  1993   APPENDECTOMY  1993   BREAST BIOPSY Right 1992   BREAST BIOPSY Right March 2014   Atypical ductal hyperplasia. Failed trial of tamoxifen  for chemoprevention.   COLONOSCOPY  08/2014   Dr. Viktoria   COLONOSCOPY WITH PROPOFOL  N/A 12/21/2017   Procedure: COLONOSCOPY WITH PROPOFOL ;  Surgeon: Viktoria Lamar DASEN, MD;  Location: [facility] ENDOSCOPY;  Service: Endoscopy;  Laterality: N/A;   DILATION AND CURETTAGE OF UTERUS  1968   ESOPHAGOGASTRODUODENOSCOPY     ESOPHAGOGASTRODUODENOSCOPY (EGD) WITH PROPOFOL  N/A 12/21/2017   Procedure: ESOPHAGOGASTRODUODENOSCOPY (EGD) WITH PROPOFOL ;  Surgeon: Viktoria Lamar DASEN, MD;  Location: [facility] ENDOSCOPY;  Service: Endoscopy;  Laterality: N/A;   FRACTURE SURGERY Right 04/20/2016   arthroplasty radial head w/ORIF   JOINT REPLACEMENT Right 04/2016    Prior to Admission medications   Medication Sig Start Date End Date Taking? Authorizing Provider  benazepril (LOTENSIN) 20 MG tablet Take 20 mg by mouth daily.   Yes [provider]  hydrochlorothiazide (MICROZIDE) 12.5 MG capsule  09/15/16  Yes [provider]  ibuprofen (ADVIL) 200 MG tablet Take 200-400 mg by mouth every 6 (six) hours as needed.   Yes [provider]  metoprolol succinate (TOPROL-XL) 25 MG 24 hr tablet  09/27/15  Yes  [provider]  Omeprazole (PRILOSEC PO) Take 20 mg by mouth daily.    Yes [provider]  rosuvastatin (CRESTOR) 5 MG tablet Take 5 mg by mouth every other day.   Yes [provider]    Allergies as of 06/14/2023 - Review Complete 12/21/2017  Allergen Reaction Noted   Ivp dye [iodinated contrast media]  09/20/2012   Metrizamide  12/20/2017    Family History  Problem Relation Age of Onset   Cancer Mother 14       lung    Social History   Socioeconomic History   Marital status: Married    Spouse name: Not on file   Number of children: Not on file   Years of education: Not on file   Highest education level: Not on file  Occupational History   Not on file  Tobacco Use   Smoking status: Never   Smokeless tobacco: Never  Vaping Use   Vaping status: Never Used  Substance and Sexual Activity   Alcohol use: No   Drug use: No   Sexual activity: Not on file  Other Topics Concern   Not on file  Social History Narrative   Not on file   Social Drivers of Health   Financial Resource Strain: Low Risk  (04/21/2023)   Received from [facility]   Overall Financial Resource Strain (CARDIA)    Difficulty of Paying Living Expenses: Not hard at all  Food Insecurity: No Food  Insecurity (04/21/2023)   Received from [facility]   Hunger Vital Sign    Worried About Running Out of Food in the Last Year: Never true    Ran Out of Food in the Last Year: Never true  Transportation Needs: No Transportation Needs (04/21/2023)   Received from [facility] - Transportation    In the past 12 months, has lack of transportation kept you from medical appointments or from getting medications?: No    Lack of Transportation (Non-Medical): No  Physical Activity: Not on file  Stress: Not on file  Social Connections: Not on file  Intimate Partner Violence: Not on file    Review of Systems: See HPI,  otherwise negative ROS  Physical Exam: BP (!) 174/78   Pulse (!) 101   Temp 98.4 F (36.9 C) (Temporal)   Resp 15   Ht 5' 5 (1.651 m)   Wt 104.3 kg   SpO2 96%   BMI 38.27 kg/m  General:   Alert, cooperative in NAD Head:  Normocephalic and atraumatic. Respiratory:  Normal work of breathing. Cardiovascular:  RRR  Impression/Plan: Megan Hodge is here for cataract surgery.  Risks, benefits, limitations, and alternatives regarding cataract surgery have been reviewed with the patient.  Questions have been answered.  All parties agreeable.   Adine Novak, MD  07/18/2023, 10:02 AM

## 2023-07-19 ENCOUNTER — Encounter: Payer: Self-pay | Admitting: Ophthalmology

## 2023-07-21 DIAGNOSIS — H2512 Age-related nuclear cataract, left eye: Secondary | ICD-10-CM | POA: Diagnosis not present

## 2023-07-28 NOTE — Discharge Instructions (Signed)

## 2023-08-01 ENCOUNTER — Ambulatory Visit: Payer: Self-pay | Admitting: Anesthesiology

## 2023-08-01 ENCOUNTER — Encounter: Payer: Self-pay | Admitting: Ophthalmology

## 2023-08-01 ENCOUNTER — Ambulatory Visit
Admission: RE | Admit: 2023-08-01 | Discharge: 2023-08-01 | Disposition: A | Discharge status: Home / Self Care | Payer: PPO | Attending: Ophthalmology | Admitting: Ophthalmology

## 2023-08-01 ENCOUNTER — Other Ambulatory Visit: Payer: Self-pay

## 2023-08-01 ENCOUNTER — Encounter
Admission: RE | Disposition: A | Discharge status: Home / Self Care | Payer: Self-pay | Source: Home / Self Care | Attending: Ophthalmology

## 2023-08-01 DIAGNOSIS — H269 Unspecified cataract: Secondary | ICD-10-CM | POA: Diagnosis not present

## 2023-08-01 DIAGNOSIS — I10 Essential (primary) hypertension: Secondary | ICD-10-CM | POA: Insufficient documentation

## 2023-08-01 DIAGNOSIS — H2512 Age-related nuclear cataract, left eye: Secondary | ICD-10-CM | POA: Insufficient documentation

## 2023-08-01 DIAGNOSIS — K219 Gastro-esophageal reflux disease without esophagitis: Secondary | ICD-10-CM | POA: Insufficient documentation

## 2023-08-01 DIAGNOSIS — I129 Hypertensive chronic kidney disease with stage 1 through stage 4 chronic kidney disease, or unspecified chronic kidney disease: Secondary | ICD-10-CM | POA: Diagnosis not present

## 2023-08-01 DIAGNOSIS — N189 Chronic kidney disease, unspecified: Secondary | ICD-10-CM | POA: Diagnosis not present

## 2023-08-01 HISTORY — PX: CATARACT EXTRACTION W/PHACO: SHX586

## 2023-08-01 SURGERY — PHACOEMULSIFICATION, CATARACT, WITH IOL INSERTION
Anesthesia: Monitor Anesthesia Care | Laterality: Left

## 2023-08-01 MED ORDER — ARMC OPHTHALMIC DILATING DROPS
OPHTHALMIC | Status: AC
  Filled 2023-08-01: qty 0.5

## 2023-08-01 MED ORDER — SIGHTPATH DOSE#1 BSS IO SOLN
INTRAOCULAR | Status: DC | PRN
  Administered 2023-08-01: 96 mL via OPHTHALMIC

## 2023-08-01 MED ORDER — SIGHTPATH DOSE#1 NA HYALUR & NA CHOND-NA HYALUR IO KIT
PACK | INTRAOCULAR | Status: DC | PRN
  Administered 2023-08-01: 1 via OPHTHALMIC

## 2023-08-01 MED ORDER — MIDAZOLAM HCL 2 MG/2ML IJ SOLN
INTRAMUSCULAR | Status: AC
  Filled 2023-08-01: qty 2

## 2023-08-01 MED ORDER — FENTANYL CITRATE (PF) 100 MCG/2ML IJ SOLN
INTRAMUSCULAR | Status: AC
  Filled 2023-08-01: qty 2

## 2023-08-01 MED ORDER — MOXIFLOXACIN HCL 0.5 % OP SOLN
OPHTHALMIC | Status: DC | PRN
  Administered 2023-08-01: .2 mL via OPHTHALMIC

## 2023-08-01 MED ORDER — TETRACAINE HCL 0.5 % OP SOLN
1.0000 [drp] | OPHTHALMIC | Status: DC | PRN
  Administered 2023-08-01 (×3): 1 [drp] via OPHTHALMIC

## 2023-08-01 MED ORDER — FENTANYL CITRATE (PF) 100 MCG/2ML IJ SOLN
INTRAMUSCULAR | Status: DC | PRN
  Administered 2023-08-01: 50 ug via INTRAVENOUS

## 2023-08-01 MED ORDER — LIDOCAINE HCL (PF) 2 % IJ SOLN
INTRAOCULAR | Status: DC | PRN
  Administered 2023-08-01: 4 mL via INTRAOCULAR

## 2023-08-01 MED ORDER — ARMC OPHTHALMIC DILATING DROPS
1.0000 | OPHTHALMIC | Status: DC | PRN
  Administered 2023-08-01 (×3): 1 via OPHTHALMIC

## 2023-08-01 MED ORDER — SIGHTPATH DOSE#1 BSS IO SOLN
INTRAOCULAR | Status: DC | PRN
  Administered 2023-08-01: 15 mL via INTRAOCULAR

## 2023-08-01 MED ORDER — MIDAZOLAM HCL 2 MG/2ML IJ SOLN
INTRAMUSCULAR | Status: DC | PRN
  Administered 2023-08-01: 2 mg via INTRAVENOUS

## 2023-08-01 MED ORDER — TETRACAINE HCL 0.5 % OP SOLN
OPHTHALMIC | Status: AC
  Filled 2023-08-01: qty 4

## 2023-08-01 SURGICAL SUPPLY — 10 items
CATARACT SUITE SIGHTPATH (MISCELLANEOUS) ×1 IMPLANT
DISSECTOR HYDRO NUCLEUS 50X22 (MISCELLANEOUS) ×1 IMPLANT
FEE CATARACT SUITE SIGHTPATH (MISCELLANEOUS) ×1 IMPLANT
GLOVE PI ULTRA LF STRL 7.5 (GLOVE) ×1 IMPLANT
GLOVE SURG POLYISOPRENE 8.5 (GLOVE) ×1 IMPLANT
GLOVE SURG SYN 8.5 PF PI BL (GLOVE) ×1 IMPLANT
LENS IOL TECNIS EYHANCE 15.5 (Intraocular Lens) IMPLANT
NDL FILTER BLUNT 18X1 1/2 (NEEDLE) ×1 IMPLANT
NEEDLE FILTER BLUNT 18X1 1/2 (NEEDLE) ×1 IMPLANT
SYR 3ML LL SCALE MARK (SYRINGE) ×1 IMPLANT

## 2023-08-01 NOTE — H&P (Signed)
[facility]   Primary Care Physician:  Patrice Paradise, MD Ophthalmologist: Dr. Willey Blade  Pre-Procedure History & Physical: HPI:  Megan Hodge is a 78 y.o. female here for cataract surgery.   Past Medical History:  Diagnosis Date   Abnormal mammogram, unspecified    right breast   Arthritis    lumbar spine   Barrett's esophagus    Cancer (HCC) 1968   cervical cancer   Chronic kidney disease    renal cystic disease   Complication of anesthesia    Slow to wake   Gastroparesis 1999   GERD (gastroesophageal reflux disease)    Hypertension 1995    Past Surgical History:  Procedure Laterality Date   ABDOMINAL HYSTERECTOMY  1993   APPENDECTOMY  1993   BREAST BIOPSY Right 1992   BREAST BIOPSY Right March 2014   Atypical ductal hyperplasia. Failed trial of tamoxifen for chemoprevention.   CATARACT EXTRACTION W/PHACO Right 07/18/2023   Procedure: CATARACT EXTRACTION PHACO AND INTRAOCULAR LENS PLACEMENT (IOC) RIGHT;  Surgeon: Nevada Crane, MD;  Location: [facility];  Service: Ophthalmology;  Laterality: Right;  10.23 0:54.5   COLONOSCOPY  08/2014   Dr. Mechele Collin   COLONOSCOPY WITH PROPOFOL N/A 12/21/2017   Procedure: COLONOSCOPY WITH PROPOFOL;  Surgeon: Scot Jun, MD;  Location: [facility];  Service: Endoscopy;  Laterality: N/A;   DILATION AND CURETTAGE OF UTERUS  1968   ESOPHAGOGASTRODUODENOSCOPY     ESOPHAGOGASTRODUODENOSCOPY (EGD) WITH PROPOFOL N/A 12/21/2017   Procedure: ESOPHAGOGASTRODUODENOSCOPY (EGD) WITH PROPOFOL;  Surgeon: Scot Jun, MD;  Location: [facility];  Service: Endoscopy;  Laterality: N/A;   FRACTURE SURGERY Right 04/20/2016   arthroplasty radial head w/ORIF   JOINT REPLACEMENT Right 04/2016    Prior to Admission medications   Medication Sig Start Date End Date Taking? Authorizing Provider  benazepril (LOTENSIN) 20 MG tablet Take 20 mg by mouth daily.   Yes [provider]  hydrochlorothiazide  (MICROZIDE) 12.5 MG capsule  09/15/16  Yes [provider]  ibuprofen (ADVIL) 200 MG tablet Take 200-400 mg by mouth every 6 (six) hours as needed.   Yes [provider]  metoprolol succinate (TOPROL-XL) 25 MG 24 hr tablet  09/27/15  Yes [provider]  Omeprazole (PRILOSEC PO) Take 20 mg by mouth daily.    Yes [provider]  rosuvastatin (CRESTOR) 5 MG tablet Take 5 mg by mouth every other day.   Yes [provider]    Allergies as of 06/14/2023 - Review Complete 12/21/2017  Allergen Reaction Noted   Ivp dye [iodinated contrast media]  09/20/2012   Metrizamide  12/20/2017    Family History  Problem Relation Age of Onset   Cancer Mother 1       lung    Social History   Socioeconomic History   Marital status: Married    Spouse name: Not on file   Number of children: Not on file   Years of education: Not on file   Highest education level: Not on file  Occupational History   Not on file  Tobacco Use   Smoking status: Never   Smokeless tobacco: Never  Vaping Use   Vaping status: Never Used  Substance and Sexual Activity   Alcohol use: No   Drug use: No   Sexual activity: Not on file  Other Topics Concern   Not on file  Social History Narrative   Not on file   Social Drivers of Health  Financial Resource Strain: Low Risk  (04/21/2023)   Received from [facility]   Overall Financial Resource Strain (CARDIA)    Difficulty of Paying Living Expenses: Not hard at all  Food Insecurity: No Food Insecurity (04/21/2023)   Received from [facility]   Hunger Vital Sign    Worried About Running Out of Food in the Last Year: Never true    Ran Out of Food in the Last Year: Never true  Transportation Needs: No Transportation Needs (04/21/2023)   Received from [facility] - Transportation    In the past 12 months, has lack of transportation kept you from medical  appointments or from getting medications?: No    Lack of Transportation (Non-Medical): No  Physical Activity: Not on file  Stress: Not on file  Social Connections: Not on file  Intimate Partner Violence: Not on file    Review of Systems: See HPI, otherwise negative ROS  Physical Exam: BP (!) 150/79   Pulse 82   Temp (!) 97.3 F (36.3 C) (Temporal)   Resp 18   Ht 5\' 5"  (1.651 m)   Wt 103 kg   SpO2 99%   BMI 37.77 kg/m  General:   Alert, cooperative in NAD Head:  Normocephalic and atraumatic. Respiratory:  Normal work of breathing. Cardiovascular:  RRR  Impression/Plan: Megan Hodge is here for cataract surgery.  Risks, benefits, limitations, and alternatives regarding cataract surgery have been reviewed with the patient.  Questions have been answered.  All parties agreeable.   Willey Blade, MD  08/01/2023, 10:21 AM

## 2023-08-01 NOTE — Transfer of Care (Signed)
Immediate Anesthesia Transfer of Care Note  Patient: Megan Hodge  Procedure(s) Performed: CATARACT EXTRACTION PHACO AND INTRAOCULAR LENS PLACEMENT (IOC) LEFT 7.51, 00:44.6 (Left)  Patient Location: PACU  Anesthesia Type: MAC  Level of Consciousness: awake, alert  and patient cooperative  Airway and Oxygen Therapy: Patient Spontanous Breathing and Patient connected to supplemental oxygen  Post-op Assessment: Post-op Vital signs reviewed, Patient's Cardiovascular Status Stable, Respiratory Function Stable, Patent Airway and No signs of Nausea or vomiting  Post-op Vital Signs: Reviewed and stable  Complications: No notable events documented.

## 2023-08-01 NOTE — Anesthesia Postprocedure Evaluation (Signed)
Anesthesia Post Note  Patient: Megan Hodge  Procedure(s) Performed: CATARACT EXTRACTION PHACO AND INTRAOCULAR LENS PLACEMENT (IOC) LEFT 7.51, 00:44.6 (Left)  Patient location during evaluation: PACU Anesthesia Type: MAC Level of consciousness: awake and alert Pain management: pain level controlled Vital Signs Assessment: post-procedure vital signs reviewed and stable Respiratory status: spontaneous breathing, nonlabored ventilation and respiratory function stable Cardiovascular status: stable and blood pressure returned to baseline Postop Assessment: no apparent nausea or vomiting Anesthetic complications: no   No notable events documented.   Last Vitals:  Vitals:   08/01/23 1048 08/01/23 1052  BP: 121/63 125/64  Pulse: 82 80  Resp: 14 15  Temp: 36.7 C 36.7 C  SpO2: 96% 96%    Last Pain:  Vitals:   08/01/23 1052  TempSrc:   PainSc: 0-No pain                 Foye Deer

## 2023-08-01 NOTE — Anesthesia Preprocedure Evaluation (Addendum)
Anesthesia Evaluation  Patient identified by MRN, date of birth, ID band Patient awake    Reviewed: Allergy & Precautions, H&P , NPO status , Patient's Chart, lab work & pertinent test results  History of Anesthesia Complications (+) history of anesthetic complications  Airway Mallampati: II  TM Distance: <3 FB Neck ROM: Full    Dental no notable dental hx.    Pulmonary neg pulmonary ROS   Pulmonary exam normal breath sounds clear to auscultation       Cardiovascular hypertension, Normal cardiovascular exam Rhythm:Regular Rate:Normal     Neuro/Psych negative neurological ROS  negative psych ROS   GI/Hepatic negative GI ROS, Neg liver ROS,GERD  ,,  Endo/Other  negative endocrine ROS    Renal/GU Renal disease  negative genitourinary   Musculoskeletal negative musculoskeletal ROS (+) Arthritis ,    Abdominal   Peds negative pediatric ROS (+)  Hematology negative hematology ROS (+)   Anesthesia Other Findings Hypertension  Cancer (HCC) Abnormal mammogram, unspecified  Barrett's esophagus Chronic kidney disease  Arthritis Gastroparesis  GERD (gastroesophageal reflux disease) Complication of anesthesia--slow to awaken    Reproductive/Obstetrics negative OB ROS                             Anesthesia Physical Anesthesia Plan  ASA: 3  Anesthesia Plan: MAC   Post-op Pain Management:    Induction: Intravenous  PONV Risk Score and Plan:   Airway Management Planned: Natural Airway and Nasal Cannula  Additional Equipment:   Intra-op Plan:   Post-operative Plan:   Informed Consent: I have reviewed the patients History and Physical, chart, labs and discussed the procedure including the risks, benefits and alternatives for the proposed anesthesia with the patient or authorized representative who has indicated his/her understanding and acceptance.     Dental Advisory  Given  Plan Discussed with: Anesthesiologist, CRNA and Surgeon  Anesthesia Plan Comments:        Anesthesia Quick Evaluation

## 2023-08-01 NOTE — Op Note (Signed)
OPERATIVE NOTE  Megan Hodge [id number] 08/01/2023   PREOPERATIVE DIAGNOSIS:  Nuclear sclerotic cataract left eye.  H25.12   POSTOPERATIVE DIAGNOSIS:    Nuclear sclerotic cataract left eye.     PROCEDURE:  Phacoemusification with posterior chamber intraocular lens placement of the left eye   LENS:   Implant Name Type Inv. Item Serial No. Manufacturer Lot No. LRB No. Used Action  LENS IOL TECNIS EYHANCE 15.5 - [id number] Intraocular Lens LENS IOL TECNIS EYHANCE 15.5 [id number] [employer]  Left 1 Implanted      Procedure(s): CATARACT EXTRACTION PHACO AND INTRAOCULAR LENS PLACEMENT (IOC) LEFT 7.51, 00:44.6 (Left)  SURGEON:  Willey Blade, MD, MPH   ANESTHESIA:  Topical with tetracaine drops augmented with 1% preservative-free intracameral lidocaine.  ESTIMATED BLOOD LOSS: <1 mL   COMPLICATIONS:  None.   DESCRIPTION OF PROCEDURE:  The patient was identified in the holding room and transported to the operating room and placed in the supine position under the operating microscope.  The left eye was identified as the operative eye and it was prepped and draped in the usual sterile ophthalmic fashion.   A 1.0 millimeter clear-corneal paracentesis was made at the 5:00 position. 0.5 ml of preservative-free 1% lidocaine with epinephrine was injected into the anterior chamber.  The anterior chamber was filled with viscoelastic.  A 2.4 millimeter keratome was used to make a near-clear corneal incision at the 2:00 position.  A curvilinear capsulorrhexis was made with a cystotome and capsulorrhexis forceps.  Balanced salt solution was used to hydrodissect and hydrodelineate the nucleus.   Phacoemulsification was then used in stop and chop fashion to remove the lens nucleus and epinucleus.  The remaining cortex was then removed using the irrigation and aspiration handpiece. Viscoelastic was then placed into the capsular bag to distend it for lens placement.  A lens was then injected into the capsular  bag.  The remaining viscoelastic was aspirated.   Wounds were hydrated with balanced salt solution.  The anterior chamber was inflated to a physiologic pressure with balanced salt solution.  Intracameral vigamox 0.1 mL undiltued was injected into the eye and a drop placed onto the ocular surface.  No wound leaks were noted.  The patient was taken to the recovery room in stable condition without complications of anesthesia or surgery  Willey Blade 08/01/2023, 10:46 AM

## 2023-08-02 ENCOUNTER — Encounter: Payer: Self-pay | Admitting: Ophthalmology

## 2023-08-23 DIAGNOSIS — Z961 Presence of intraocular lens: Secondary | ICD-10-CM | POA: Diagnosis not present

## 2023-10-13 DIAGNOSIS — R7303 Prediabetes: Secondary | ICD-10-CM | POA: Diagnosis not present

## 2023-10-13 DIAGNOSIS — E782 Mixed hyperlipidemia: Secondary | ICD-10-CM | POA: Diagnosis not present

## 2023-10-13 DIAGNOSIS — I10 Essential (primary) hypertension: Secondary | ICD-10-CM | POA: Diagnosis not present

## 2023-10-20 DIAGNOSIS — R7303 Prediabetes: Secondary | ICD-10-CM | POA: Diagnosis not present

## 2023-10-20 DIAGNOSIS — N6099 Unspecified benign mammary dysplasia of unspecified breast: Secondary | ICD-10-CM | POA: Diagnosis not present

## 2023-10-20 DIAGNOSIS — N1831 Chronic kidney disease, stage 3a: Secondary | ICD-10-CM | POA: Diagnosis not present

## 2023-10-20 DIAGNOSIS — Z1231 Encounter for screening mammogram for malignant neoplasm of breast: Secondary | ICD-10-CM | POA: Diagnosis not present

## 2023-10-20 DIAGNOSIS — Z0000 Encounter for general adult medical examination without abnormal findings: Secondary | ICD-10-CM | POA: Diagnosis not present

## 2023-10-20 DIAGNOSIS — I10 Essential (primary) hypertension: Secondary | ICD-10-CM | POA: Diagnosis not present

## 2023-10-20 DIAGNOSIS — E782 Mixed hyperlipidemia: Secondary | ICD-10-CM | POA: Diagnosis not present

## 2024-02-24 DIAGNOSIS — Z961 Presence of intraocular lens: Secondary | ICD-10-CM | POA: Diagnosis not present

## 2024-02-24 DIAGNOSIS — H43813 Vitreous degeneration, bilateral: Secondary | ICD-10-CM | POA: Diagnosis not present

## 2024-02-24 DIAGNOSIS — H04123 Dry eye syndrome of bilateral lacrimal glands: Secondary | ICD-10-CM | POA: Diagnosis not present

## 2024-02-24 DIAGNOSIS — H26493 Other secondary cataract, bilateral: Secondary | ICD-10-CM | POA: Diagnosis not present

## 2024-04-16 DIAGNOSIS — E782 Mixed hyperlipidemia: Secondary | ICD-10-CM | POA: Diagnosis not present

## 2024-04-16 DIAGNOSIS — R7303 Prediabetes: Secondary | ICD-10-CM | POA: Diagnosis not present

## 2024-04-16 DIAGNOSIS — I10 Essential (primary) hypertension: Secondary | ICD-10-CM | POA: Diagnosis not present

## 2024-04-23 DIAGNOSIS — Z0000 Encounter for general adult medical examination without abnormal findings: Secondary | ICD-10-CM | POA: Diagnosis not present

## 2024-04-23 DIAGNOSIS — N1831 Chronic kidney disease, stage 3a: Secondary | ICD-10-CM | POA: Diagnosis not present

## 2024-04-23 DIAGNOSIS — I10 Essential (primary) hypertension: Secondary | ICD-10-CM | POA: Diagnosis not present

## 2024-04-23 DIAGNOSIS — R7303 Prediabetes: Secondary | ICD-10-CM | POA: Diagnosis not present

## 2024-04-23 DIAGNOSIS — Z1331 Encounter for screening for depression: Secondary | ICD-10-CM | POA: Diagnosis not present

## 2024-04-23 DIAGNOSIS — E782 Mixed hyperlipidemia: Secondary | ICD-10-CM | POA: Diagnosis not present

## 2024-04-23 DIAGNOSIS — R5383 Other fatigue: Secondary | ICD-10-CM | POA: Diagnosis not present
